# Patient Record
Sex: Female | Born: 1945 | Race: White | Hispanic: No | Smoking: Never smoker
Health system: Southern US, Community
[De-identification: ages and names within clinical notes are randomized; demographics above are authoritative.]

## PROBLEM LIST (undated history)

## (undated) DIAGNOSIS — I10 Essential (primary) hypertension: Secondary | ICD-10-CM

## (undated) DIAGNOSIS — M797 Fibromyalgia: Secondary | ICD-10-CM

---

## 2014-12-25 ENCOUNTER — Inpatient Hospital Stay (HOSPITAL_COMMUNITY)
Admission: EM | Admit: 2014-12-25 | Discharge: 2014-12-28 | DRG: 287 | Disposition: A | Discharge status: Home / Self Care | Payer: Medicare HMO | Attending: Cardiovascular Disease | Admitting: Cardiovascular Disease

## 2014-12-25 ENCOUNTER — Encounter (HOSPITAL_COMMUNITY): Payer: Self-pay

## 2014-12-25 ENCOUNTER — Emergency Department (HOSPITAL_COMMUNITY): Payer: Medicare HMO

## 2014-12-25 ENCOUNTER — Ambulatory Visit (HOSPITAL_COMMUNITY): Payer: Medicare HMO

## 2014-12-25 DIAGNOSIS — F329 Major depressive disorder, single episode, unspecified: Secondary | ICD-10-CM | POA: Diagnosis present

## 2014-12-25 DIAGNOSIS — Z7982 Long term (current) use of aspirin: Secondary | ICD-10-CM | POA: Diagnosis not present

## 2014-12-25 DIAGNOSIS — R079 Chest pain, unspecified: Secondary | ICD-10-CM | POA: Diagnosis present

## 2014-12-25 DIAGNOSIS — I249 Acute ischemic heart disease, unspecified: Secondary | ICD-10-CM | POA: Diagnosis present

## 2014-12-25 DIAGNOSIS — I480 Paroxysmal atrial fibrillation: Secondary | ICD-10-CM | POA: Diagnosis present

## 2014-12-25 DIAGNOSIS — M797 Fibromyalgia: Secondary | ICD-10-CM | POA: Diagnosis present

## 2014-12-25 DIAGNOSIS — E876 Hypokalemia: Secondary | ICD-10-CM | POA: Diagnosis present

## 2014-12-25 DIAGNOSIS — I10 Essential (primary) hypertension: Secondary | ICD-10-CM | POA: Diagnosis present

## 2014-12-25 DIAGNOSIS — I4891 Unspecified atrial fibrillation: Secondary | ICD-10-CM

## 2014-12-25 HISTORY — DX: Fibromyalgia: M79.7

## 2014-12-25 HISTORY — DX: Essential (primary) hypertension: I10

## 2014-12-25 LAB — TROPONIN I
Troponin I: <0.03 ng/mL (ref <0.031)
Troponin I: 0.04 ng/mL — ABNORMAL HIGH (ref <0.031)
Troponin I: 0.04 ng/mL — ABNORMAL HIGH (ref <0.031)
Troponin I: 0.05 ng/mL — ABNORMAL HIGH (ref <0.031)

## 2014-12-25 LAB — BASIC METABOLIC PANEL
Anion gap: 17 — ABNORMAL HIGH (ref 5–15)
Anion gap: 9 (ref 5–15)
BUN: 14 mg/dL (ref 6–20)
BUN: 11 mg/dL (ref 6–20)
CO2: 16 mmol/L — ABNORMAL LOW (ref 22–32)
CO2: 23 mmol/L (ref 22–32)
Calcium: 10 mg/dL (ref 8.9–10.3)
Calcium: 9.6 mg/dL (ref 8.9–10.3)
Chloride: 106 mmol/L (ref 101–111)
Chloride: 105 mmol/L (ref 101–111)
Creatinine, Ser: 1.14 mg/dL — ABNORMAL HIGH (ref 0.44–1.00)
Creatinine, Ser: 0.98 mg/dL (ref 0.44–1.00)
GFR calc Af Amer: 55 mL/min — ABNORMAL LOW (ref >60)
GFR calc Af Amer: >60 mL/min (ref >60)
GFR calc non Af Amer: 48 mL/min — ABNORMAL LOW (ref >60)
GFR calc non Af Amer: 57 mL/min — ABNORMAL LOW (ref >60)
Glucose, Bld: 179 mg/dL — ABNORMAL HIGH (ref 65–99)
Glucose, Bld: 144 mg/dL — ABNORMAL HIGH (ref 65–99)
Potassium: 2.3 mmol/L — CL (ref 3.5–5.1)
Potassium: 2.5 mmol/L — CL (ref 3.5–5.1)
Sodium: 139 mmol/L (ref 135–145)
Sodium: 137 mmol/L (ref 135–145)

## 2014-12-25 LAB — I-STAT CHEM 8, ED
BUN: 14 mg/dL (ref 6–20)
Calcium, Ion: 1.13 mmol/L (ref 1.13–1.30)
Chloride: 105 mmol/L (ref 101–111)
Creatinine, Ser: 0.8 mg/dL (ref 0.44–1.00)
Glucose, Bld: 226 mg/dL — ABNORMAL HIGH (ref 65–99)
HCT: 52 % — ABNORMAL HIGH (ref 36.0–46.0)
Hemoglobin: 17.7 g/dL — ABNORMAL HIGH (ref 12.0–15.0)
Potassium: 2.4 mmol/L — CL (ref 3.5–5.1)
Sodium: 141 mmol/L (ref 135–145)
TCO2: 15 mmol/L (ref 0–100)

## 2014-12-25 LAB — CBC
HCT: 40.3 % (ref 36.0–46.0)
Hemoglobin: 13.3 g/dL (ref 12.0–15.0)
MCH: 28.4 pg (ref 26.0–34.0)
MCHC: 33 g/dL (ref 30.0–36.0)
MCV: 85.9 fL (ref 78.0–100.0)
Platelets: 633 10*3/uL — ABNORMAL HIGH (ref 150–400)
RBC: 4.69 MIL/uL (ref 3.87–5.11)
RDW: 14.3 % (ref 11.5–15.5)
WBC: 15.3 10*3/uL — ABNORMAL HIGH (ref 4.0–10.5)

## 2014-12-25 LAB — BRAIN NATRIURETIC PEPTIDE: B Natriuretic Peptide: 18.8 pg/mL (ref 0.0–100.0)

## 2014-12-25 LAB — HEPARIN LEVEL (UNFRACTIONATED): Heparin Unfractionated: 0.27 [IU]/mL — ABNORMAL LOW (ref 0.30–0.70)

## 2014-12-25 LAB — MRSA PCR SCREENING: MRSA by PCR: NEGATIVE

## 2014-12-25 LAB — MAGNESIUM: Magnesium: 1.6 mg/dL — ABNORMAL LOW (ref 1.7–2.4)

## 2014-12-25 MED ORDER — CARVEDILOL 3.125 MG PO TABS
3.1250 mg | ORAL_TABLET | Freq: Two times a day (BID) | ORAL | Status: DC
  Administered 2014-12-26 – 2014-12-28 (×3): 3.125 mg via ORAL
  Filled 2014-12-25 (×4): qty 1

## 2014-12-25 MED ORDER — METOCLOPRAMIDE HCL 5 MG/ML IJ SOLN
10.0000 mg | Freq: Once | INTRAMUSCULAR | Status: AC
  Administered 2014-12-25: 10 mg via INTRAVENOUS
  Filled 2014-12-25: qty 2

## 2014-12-25 MED ORDER — HEPARIN BOLUS VIA INFUSION
2000.0000 [IU] | Freq: Once | INTRAVENOUS | Status: AC
  Administered 2014-12-25: 2000 [IU] via INTRAVENOUS
  Filled 2014-12-25: qty 2000

## 2014-12-25 MED ORDER — POTASSIUM CHLORIDE CRYS ER 20 MEQ PO TBCR
20.0000 meq | EXTENDED_RELEASE_TABLET | Freq: Two times a day (BID) | ORAL | Status: DC
  Administered 2014-12-25: 20 meq via ORAL
  Filled 2014-12-25: qty 1

## 2014-12-25 MED ORDER — ASPIRIN EC 81 MG PO TBEC
81.0000 mg | DELAYED_RELEASE_TABLET | Freq: Every day | ORAL | Status: DC
  Administered 2014-12-26 – 2014-12-28 (×3): 81 mg via ORAL
  Filled 2014-12-25 (×3): qty 1

## 2014-12-25 MED ORDER — VITAMIN D3 25 MCG (1000 UNIT) PO TABS
2000.0000 [IU] | ORAL_TABLET | Freq: Every day | ORAL | Status: DC
  Administered 2014-12-25 – 2014-12-28 (×4): 2000 [IU] via ORAL
  Filled 2014-12-25 (×7): qty 2

## 2014-12-25 MED ORDER — MAGNESIUM SULFATE 2 GM/50ML IV SOLN
2.0000 g | Freq: Once | INTRAVENOUS | Status: AC
  Administered 2014-12-25: 2 g via INTRAVENOUS
  Filled 2014-12-25: qty 50

## 2014-12-25 MED ORDER — ONDANSETRON HCL 4 MG/2ML IJ SOLN
4.0000 mg | Freq: Once | INTRAMUSCULAR | Status: AC
  Administered 2014-12-25: 4 mg via INTRAVENOUS
  Filled 2014-12-25: qty 2

## 2014-12-25 MED ORDER — POTASSIUM CHLORIDE CRYS ER 20 MEQ PO TBCR
20.0000 meq | EXTENDED_RELEASE_TABLET | ORAL | Status: AC
  Administered 2014-12-25: 20 meq via ORAL
  Filled 2014-12-25: qty 1

## 2014-12-25 MED ORDER — ONDANSETRON HCL 4 MG/2ML IJ SOLN
4.0000 mg | Freq: Four times a day (QID) | INTRAMUSCULAR | Status: DC | PRN
  Administered 2014-12-25: 4 mg via INTRAVENOUS
  Filled 2014-12-25: qty 2

## 2014-12-25 MED ORDER — FLUOXETINE HCL 20 MG PO CAPS
20.0000 mg | ORAL_CAPSULE | Freq: Every day | ORAL | Status: DC
  Administered 2014-12-25 – 2014-12-28 (×4): 20 mg via ORAL
  Filled 2014-12-25 (×4): qty 1

## 2014-12-25 MED ORDER — ACETAMINOPHEN 325 MG PO TABS
650.0000 mg | ORAL_TABLET | ORAL | Status: DC | PRN
  Administered 2014-12-26: 650 mg via ORAL
  Filled 2014-12-25 (×2): qty 2

## 2014-12-25 MED ORDER — NITROGLYCERIN 0.4 MG SL SUBL: 0.4000 mg | SUBLINGUAL_TABLET | SUBLINGUAL | Status: DC | PRN

## 2014-12-25 MED ORDER — BUSPIRONE HCL 15 MG PO TABS
15.0000 mg | ORAL_TABLET | Freq: Two times a day (BID) | ORAL | Status: DC
  Administered 2014-12-25 – 2014-12-28 (×7): 15 mg via ORAL
  Filled 2014-12-25 (×8): qty 1

## 2014-12-25 MED ORDER — HEPARIN (PORCINE) IN NACL 100-0.45 UNIT/ML-% IJ SOLN
1100.0000 [IU]/h | INTRAMUSCULAR | Status: DC
  Administered 2014-12-25: 900 [IU]/h via INTRAVENOUS
  Administered 2014-12-26: 1050 [IU]/h via INTRAVENOUS
  Administered 2014-12-27: 1100 [IU]/h via INTRAVENOUS
  Filled 2014-12-25 (×3): qty 250

## 2014-12-25 MED ORDER — ALPRAZOLAM 0.25 MG PO TABS
0.2500 mg | ORAL_TABLET | Freq: Two times a day (BID) | ORAL | Status: DC | PRN
  Administered 2014-12-25: 0.25 mg via ORAL
  Filled 2014-12-25: qty 1

## 2014-12-25 MED ORDER — POTASSIUM CHLORIDE CRYS ER 20 MEQ PO TBCR
20.0000 meq | EXTENDED_RELEASE_TABLET | Freq: Once | ORAL | Status: AC
  Administered 2014-12-25: 20 meq via ORAL
  Filled 2014-12-25: qty 1

## 2014-12-25 MED ORDER — DILTIAZEM HCL 30 MG PO TABS
30.0000 mg | ORAL_TABLET | Freq: Four times a day (QID) | ORAL | Status: DC
Start: 2014-12-26 — End: 2014-12-26
  Administered 2014-12-25 – 2014-12-26 (×3): 30 mg via ORAL
  Filled 2014-12-25 (×4): qty 1

## 2014-12-25 MED ORDER — POTASSIUM CHLORIDE CRYS ER 20 MEQ PO TBCR
20.0000 meq | EXTENDED_RELEASE_TABLET | Freq: Two times a day (BID) | ORAL | Status: DC
  Administered 2014-12-25 – 2014-12-26 (×2): 20 meq via ORAL
  Filled 2014-12-25 (×2): qty 1

## 2014-12-25 MED ORDER — SODIUM CHLORIDE 0.9 % IJ SOLN: 3.0000 mL | INTRAMUSCULAR | Status: DC | PRN

## 2014-12-25 MED ORDER — DILTIAZEM HCL 100 MG IV SOLR
5.0000 mg/h | INTRAVENOUS | Status: DC
  Administered 2014-12-25: 5 mg/h via INTRAVENOUS
  Administered 2014-12-25: 10 mg/h via INTRAVENOUS
  Filled 2014-12-25 (×3): qty 100

## 2014-12-25 MED ORDER — SODIUM CHLORIDE 0.9 % IJ SOLN
3.0000 mL | Freq: Two times a day (BID) | INTRAMUSCULAR | Status: DC
  Administered 2014-12-25 – 2014-12-27 (×4): 3 mL via INTRAVENOUS

## 2014-12-25 MED ORDER — OFF THE BEAT BOOK
Freq: Once | Status: AC
  Administered 2014-12-25: 17:00:00
  Filled 2014-12-25: qty 1

## 2014-12-25 MED ORDER — ATORVASTATIN CALCIUM 40 MG PO TABS
40.0000 mg | ORAL_TABLET | Freq: Every day | ORAL | Status: DC
  Administered 2014-12-25 – 2014-12-27 (×3): 40 mg via ORAL
  Filled 2014-12-25 (×4): qty 1

## 2014-12-25 MED ORDER — LISINOPRIL 20 MG PO TABS
40.0000 mg | ORAL_TABLET | Freq: Every day | ORAL | Status: DC
  Administered 2014-12-26: 40 mg via ORAL
  Filled 2014-12-25: qty 2

## 2014-12-25 MED ORDER — POTASSIUM CHLORIDE 10 MEQ/100ML IV SOLN
10.0000 meq | Freq: Once | INTRAVENOUS | Status: AC
  Administered 2014-12-25: 10 meq via INTRAVENOUS
  Filled 2014-12-25: qty 100

## 2014-12-25 MED ORDER — DILTIAZEM LOAD VIA INFUSION
15.0000 mg | Freq: Once | INTRAVENOUS | Status: AC
  Administered 2014-12-25: 15 mg via INTRAVENOUS
  Filled 2014-12-25: qty 15

## 2014-12-25 MED ORDER — ASPIRIN 300 MG RE SUPP: 300.0000 mg | RECTAL | Status: AC

## 2014-12-25 MED ORDER — ASPIRIN 81 MG PO CHEW
324.0000 mg | CHEWABLE_TABLET | ORAL | Status: AC
  Administered 2014-12-25: 324 mg via ORAL
  Filled 2014-12-25: qty 4

## 2014-12-25 MED ORDER — SODIUM CHLORIDE 0.9 % IV SOLN: 250.0000 mL | INTRAVENOUS | Status: DC | PRN

## 2014-12-25 MED ORDER — GABAPENTIN 300 MG PO CAPS
300.0000 mg | ORAL_CAPSULE | Freq: Three times a day (TID) | ORAL | Status: DC
  Administered 2014-12-25 – 2014-12-28 (×9): 300 mg via ORAL
  Filled 2014-12-25 (×10): qty 1

## 2014-12-25 NOTE — Care Management Note (Signed)
Case Management Note  Patient Details  Name: Mary Weeks MRN: [id number] Date of Birth: [birth year]  Subjective/Objective:          Adm w at fib         Action/Plan: lives at home   Expected Discharge Date:                  Expected Discharge Plan:     In-House Referral:     Discharge planning Services     Post Acute Care Choice:    Choice offered to:     DME Arranged:    DME Agency:     HH Arranged:    [place] Agency:     Status of Service:     Medicare Important Message Given:    Date Medicare IM Given:    Medicare IM give by:    Date Additional Medicare IM Given:    Additional Medicare Important Message give by:     If discussed at [place] of Stay Meetings, dates discussed:    Additional Comments: ur review done  Lacretia Leigh, RN 12/25/2014, 12:59 PM

## 2014-12-25 NOTE — Progress Notes (Signed)
Patient blood pressure down 88/40 heart rate 95 sinus rhythm asymptomatic.Call placed to Dr. Doylene Canard  Order received to discontinue cardizem drip.Will continue to monitor patient.

## 2014-12-25 NOTE — Progress Notes (Signed)
[facility] for heparin Indication: chest pain/ACS and atrial fibrillation  No Known Allergies  Patient Measurements: Height: 5' (152.4 cm) Weight: 183 lb (83.008 kg) IBW/kg (Calculated) : 45.5 Heparin Dosing Weight: 65kg  Vital Signs: Temp: 97.5 F (36.4 C) (12/05 1215) Temp Source: Oral (12/05 1215) BP: 130/57 mmHg (12/05 1215) Pulse Rate: 123 (12/05 1215)  Labs:  Recent Labs  12/25/14 0400 12/25/14 0429 12/25/14 0849 12/25/14 1425  HGB 13.3 17.7*  --   --   HCT 40.3 52.0*  --   --   PLT 633*  --   --   --   HEPARINUNFRC  --   --   --  0.27*  CREATININE 1.14* 0.80  --   --   TROPONINI <0.03  --  0.04*  --     Estimated Creatinine Clearance: 62.5 mL/min (by C-G formula based on Cr of 0.8).   Medical History: Past Medical History  Diagnosis Date  . Hypertension   . Fibromyalgia     Assessment: 69yo female c/o CP and N/V, found to be in Afib w/ RVR en route and in ED, thought to be 2/2 electrolyte abnormalities, to begin heparin for concern for ACS. Cardiac enzymes negative, patient back in nsr (100-120s). Initial heparin level just below goal at 0.27. No bleeding issues noted.  Goal of Therapy:  Heparin level 0.3-0.7 units/ml Monitor platelets by anticoagulation protocol: Yes   Plan:  Increase heparin rate to 1050/hr and recheck level in am. Daily CBC/HL  Erin Hearing PharmD., BCPS Clinical Pharmacist Pager [phone] 12/25/2014 3:19 PM

## 2014-12-25 NOTE — Progress Notes (Signed)
ANTICOAGULATION CONSULT NOTE - Initial Consult  Pharmacy Consult for heparin Indication: chest pain/ACS and atrial fibrillation  No Known Allergies  Patient Measurements: Height: 5' (152.4 cm) Weight: 183 lb (83.008 kg) IBW/kg (Calculated) : 45.5 Heparin Dosing Weight: 65kg  Vital Signs: Temp: 97.4 F (36.3 C) (12/05 0340) Temp Source: Oral (12/05 0340) BP: 123/78 mmHg (12/05 0545) Pulse Rate: 109 (12/05 0545)  Labs:  Recent Labs  12/25/14 0400 12/25/14 0429  HGB 13.3 17.7*  HCT 40.3 52.0*  PLT 633*  --   CREATININE 1.14* 0.80  TROPONINI <0.03  --     Estimated Creatinine Clearance: 62.5 mL/min (by C-G formula based on Cr of 0.8).   Medical History: Past Medical History  Diagnosis Date  . Hypertension   . Fibromyalgia      Assessment: 69yo female c/o CP and N/V, found to be in Afib w/ RVR en route and in ED, thought to be 2/2 electrolyte abnormalities, to begin heparin for concern for ACS.  Goal of Therapy:  Heparin level 0.3-0.7 units/ml Monitor platelets by anticoagulation protocol: Yes   Plan:  Will give heparin 2000 units x1 followed by gtt at 900 units/hr and monitor heparin levels and CBC.  Wynona Neat, PharmD, BCPS  12/25/2014,6:11 AM

## 2014-12-25 NOTE — Progress Notes (Signed)
  Echocardiogram 2D Echocardiogram has been performed.  Mary Weeks 12/25/2014, 4:32 PM

## 2014-12-25 NOTE — ED Notes (Signed)
Pt come from home via [facility] EMS, around 730 had a pizza and became nauseated,  CP started around around 230, pt in afib with RVR.

## 2014-12-25 NOTE — ED Provider Notes (Signed)
CSN: [id number]     Arrival date & time 12/25/14  0330 History  By signing my name below, I, Evelene Croon, attest that this documentation has been prepared under the direction and in the presence of Noemi Chapel, MD . Electronically Signed: Evelene Croon, Scribe. 12/25/2014. 3:56 AM.     Chief Complaint  Patient presents with  . Atrial Fibrillation    The history is provided by the patient and the EMS personnel. No language interpreter was used.    HPI Comments:  Mary Weeks is a 69 y.o. female  with a history of HTN, who presents to the Emergency Department via EMS from home, complaining of 3/10 CP, onset ~1930 last night with associated palpitations. EMS notes CP was preceded by nausea and 1 episode of vomiting after eating pizza. EMS states pt in AFIB with RVR en route. Pt denies h/o AFIB. She was recently started on magnesium and sodium bicarbonate and was also recently admitted to [facility] for low potassium. She denies constipation and dysuria. No alleviating factors noted.   Past Medical History  Diagnosis Date  . Hypertension   . Fibromyalgia    History reviewed. No pertinent past surgical history. No family history on file. Social History  Substance Use Topics  . Smoking status: Never Smoker   . Smokeless tobacco: None  . Alcohol Use: No   OB History    No data available     Review of Systems  Cardiovascular: Positive for chest pain and palpitations.  Gastrointestinal: Positive for nausea and vomiting. Negative for constipation.  Genitourinary: Negative for dysuria.    Allergies  Review of patient's allergies indicates no known allergies.  Home Medications   Prior to Admission medications   Medication Sig Start Date End Date Taking? Authorizing Provider  aspirin EC 81 MG tablet Take 81 mg by mouth daily.   Yes Historical Provider, MD  busPIRone (BUSPAR) 15 MG tablet Take 15 mg by mouth 2 (two) times daily.   Yes Historical Provider, MD  Cholecalciferol  (VITAMIN D3) 2000 UNITS capsule Take 2,000 Units by mouth daily.   Yes Historical Provider, MD  DULoxetine (CYMBALTA) 30 MG capsule Take 30 mg by mouth daily.   Yes Historical Provider, MD  FLUoxetine (PROZAC) 20 MG capsule Take 20 mg by mouth daily.   Yes Historical Provider, MD  gabapentin (NEURONTIN) 300 MG capsule Take 300 mg by mouth 3 (three) times daily.   Yes Historical Provider, MD  lisinopril (PRINIVIL,ZESTRIL) 40 MG tablet Take 40 mg by mouth daily.   Yes Historical Provider, MD  meloxicam (MOBIC) 15 MG tablet Take 15 mg by mouth daily.   Yes Historical Provider, MD  potassium chloride SA (K-DUR,KLOR-CON) 20 MEQ tablet Take 20 mEq by mouth 2 (two) times daily.   Yes Historical Provider, MD   BP 121/74 mmHg  Pulse 160  Temp(Src) 97.4 F (36.3 C) (Oral)  Resp 22  SpO2 100% Physical Exam  Constitutional: She appears well-developed and well-nourished. No distress.  HENT:  Head: Normocephalic and atraumatic.  Mouth/Throat: Oropharynx is clear and moist. No oropharyngeal exudate.  Eyes: Conjunctivae and EOM are normal. Pupils are equal, round, and reactive to light. Right eye exhibits no discharge. Left eye exhibits no discharge. No scleral icterus.  Neck: Normal range of motion. Neck supple. No JVD present. No thyromegaly present.  Cardiovascular: Normal heart sounds and intact distal pulses.  An irregularly irregular rhythm present. Tachycardia present.  Exam reveals no gallop and no friction rub.   No  murmur heard. Pulses:      Femoral pulses are 2+ on the right side, and 2+ on the left side. Weak radial pulses   Pulmonary/Chest: Effort normal and breath sounds normal. No respiratory distress. She has no wheezes. She has no rales.  Abdominal: Soft. Bowel sounds are normal. She exhibits no distension and no mass. There is no tenderness.  Musculoskeletal: Normal range of motion. She exhibits no edema or tenderness.  Lymphadenopathy:    She has no cervical adenopathy.   Neurological: She is alert. Coordination normal.  Skin: Skin is warm and dry. No rash noted. No erythema.  Psychiatric: She has a normal mood and affect. Her behavior is normal.  Nursing note and vitals reviewed.   ED Course  Procedures   DIAGNOSTIC STUDIES:  Oxygen Saturation is 100% on RA, normal by my interpretation.    COORDINATION OF CARE:  3:38 AM Discussed treatment plan with pt at bedside and pt agreed to plan.  Labs Review Labs Reviewed  BASIC METABOLIC PANEL - Abnormal; Notable for the following:    Potassium 2.3 (*)    CO2 16 (*)    Glucose, Bld 179 (*)    Creatinine, Ser 1.14 (*)    GFR calc non Af Amer 48 (*)    GFR calc Af Amer 55 (*)    Anion gap 17 (*)    All other components within normal limits  CBC - Abnormal; Notable for the following:    WBC 15.3 (*)    Platelets 633 (*)    All other components within normal limits  MAGNESIUM - Abnormal; Notable for the following:    Magnesium 1.6 (*)    All other components within normal limits  I-STAT CHEM 8, ED - Abnormal; Notable for the following:    Potassium 2.4 (*)    Glucose, Bld 226 (*)    Hemoglobin 17.7 (*)    HCT 52.0 (*)    All other components within normal limits  TROPONIN I  BRAIN NATRIURETIC PEPTIDE    Imaging Review Dg Chest Port 1 View  12/25/2014  CLINICAL DATA:  69 year old female with chest pain EXAM: PORTABLE CHEST 1 VIEW COMPARISON:  None. FINDINGS: Single portable view of the chest demonstrates clear lungs. There is no pleural effusion or pneumothorax. Borderline cardiac silhouette. The osseous structures are grossly unremarkable. IMPRESSION: No active disease. Electronically Signed   By: Anner Crete M.D.   On: 12/25/2014 03:52   I have personally reviewed and evaluated these images and lab results as part of my medical decision-making.   EKG Interpretation   Date/Time:  Monday December 25 2014 03:42:39 EST Ventricular Rate:  173 PR Interval:    QRS Duration: 97 QT  Interval:  325 QTC Calculation: 551 R Axis:   -121 Text Interpretation:  Atrial fibrillation with rapid V-rate Ventricular  bigeminy S1,S2,S3 pattern Consider RVH w/ secondary repol abnormality ST  depression, probably rate related Abnormal ekg No old tracing to compare  Confirmed by Darnita Woodrum  MD, [place] ([id number]) on 12/25/2014 3:52:34 AM      EKG Interpretation  Date/Time:  Monday December 25 2014 05:26:05 EST Ventricular Rate:  92 PR Interval:  150 QRS Duration: 92 QT Interval:  395 QTC Calculation: 489 R Axis:   102 Text Interpretation:  Sinus rhythm Atrial premature complexes in couplets Consider right ventricular hypertrophy Borderline T abnormalities, anterior leads Borderline prolonged QT interval Since last tracing Atrial fibrillation has resolved. Confirmed by Sabra Heck  MD, [place] ([id number]) on 12/25/2014 5:47:20  AM        MDM   Final diagnoses:  Atrial fibrillation with rapid ventricular response (HCC)  Hypokalemia  Hypomagnesemia    The patient continued to be in atrial fibrillation with rapid ventricular rate, she ended up having severe hypokalemia with hypomagnesemia, this required both magnesium and potassium replacement intravenously. Her heart rate is now in the 100 range, she has occasional burst of atrial fibrillation, she is still on a Cardizem drip. I have discussed her care with the cardiologist Dr. Doylene Canard who will come to see the patient for admission, now that she is in sinus rhythm she may be admitted to the hospitalist, it is unclear. She requires ongoing cardiac monitoring for QT prolongation due to hypokalemia as well to recurrent arrhythmia. Otherwise labs appear unremarkable. I suspect that her atrial fibrillation was brought on in part by the severe Electra light abnormality.   CRITICAL CARE Performed by: Noemi Chapel, MD Total critical care time: 35 minutes Critical care time was exclusive of separately billable procedures and treating other  patients. Critical care was necessary to treat or prevent imminent or life-threatening deterioration. Critical care was time spent personally by me on the following activities: development of treatment plan with patient and/or surrogate as well as nursing, discussions with consultants, evaluation of patient's response to treatment, examination of patient, obtaining history from patient or surrogate, ordering and performing treatments and interventions, ordering and review of laboratory studies, ordering and review of radiographic studies, pulse oximetry and re-evaluation of patient's condition.  Meds given in ED:  Medications  diltiazem (CARDIZEM) 1 mg/mL load via infusion 15 mg (15 mg Intravenous Given 12/25/14 0405)    And  diltiazem (CARDIZEM) 100 mg in dextrose 5 % 100 mL (1 mg/mL) infusion (15 mg/hr Intravenous Rate/Dose Change 12/25/14 0508)  potassium chloride 10 mEq in 100 mL IVPB (10 mEq Intravenous New Bag/Given 12/25/14 0510)  magnesium sulfate IVPB 2 g 50 mL (2 g Intravenous New Bag/Given 12/25/14 0509)  ondansetron (ZOFRAN) injection 4 mg (4 mg Intravenous Given 12/25/14 0354)  ondansetron (ZOFRAN) injection 4 mg (4 mg Intravenous Given 12/25/14 0414)  metoCLOPramide (REGLAN) injection 10 mg (10 mg Intravenous Given 12/25/14 0430)    New Prescriptions   No medications on file     I personally performed the services described in this documentation, which was scribed in my presence. The recorded information has been reviewed and is accurate.       Noemi Chapel, MD 12/25/14 [phone]

## 2014-12-25 NOTE — ED Notes (Signed)
Report called  

## 2014-12-25 NOTE — H&P (Signed)
Referring Physician:  [name] is an 69 y.o. female.                       Chief Complaint: Chest pain  HPI: 69 y.o. female with a history of fibromyalgia and HTN presents to the Emergency Department via EMS from home, complaining of 3/10 CP, onset ~1930 last night with associated palpitations. EMS notes CP was preceded by nausea and 1 episode of vomiting after eating pizza. EMS states pt in AFIB with RVR en route. Pt denies h/o AFIB. She was recently started on magnesium and sodium bicarbonate and was also recently admitted to [facility] for low potassium.  Past Medical History  Diagnosis Date  . Hypertension   . Fibromyalgia       History reviewed. No pertinent past surgical history.  No family history on file. Social History:  reports that she has never smoked. She does not have any smokeless tobacco history on file. She reports that she does not drink alcohol. Her drug history is not on file.  Allergies: No Known Allergies   (Not in a hospital admission)  Results for orders placed or performed during the hospital encounter of 12/25/14 (from the past 48 hour(s))  Basic metabolic panel     Status: Abnormal   Collection Time: 12/25/14  4:00 AM  Result Value Ref Range   Sodium 139 135 - 145 mmol/L   Potassium 2.3 (LL) 3.5 - 5.1 mmol/L    Comment: CRITICAL RESULT CALLED TO, READ BACK BY AND VERIFIED WITH: SERRAINOLO,J RN 12/25/2014 0449 JORDANS    Chloride 106 101 - 111 mmol/L   CO2 16 (L) 22 - 32 mmol/L   Glucose, Bld 179 (H) 65 - 99 mg/dL   BUN 14 6 - 20 mg/dL   Creatinine, Ser 1.14 (H) 0.44 - 1.00 mg/dL   Calcium 10.0 8.9 - 10.3 mg/dL   GFR calc non Af Amer 48 (L) >60 mL/min   GFR calc Af Amer 55 (L) >60 mL/min    Comment: (NOTE) The eGFR has been calculated using the CKD EPI equation. This calculation has not been validated in all clinical situations. eGFR's persistently <60 mL/min signify possible Chronic Kidney Disease.    Anion gap 17 (H) 5 - 15  CBC      Status: Abnormal   Collection Time: 12/25/14  4:00 AM  Result Value Ref Range   WBC 15.3 (H) 4.0 - 10.5 K/uL   RBC 4.69 3.87 - 5.11 MIL/uL   Hemoglobin 13.3 12.0 - 15.0 g/dL   HCT 40.3 36.0 - 46.0 %   MCV 85.9 78.0 - 100.0 fL   MCH 28.4 26.0 - 34.0 pg   MCHC 33.0 30.0 - 36.0 g/dL   RDW 14.3 11.5 - 15.5 %   Platelets 633 (H) 150 - 400 K/uL  Troponin I     Status: None   Collection Time: 12/25/14  4:00 AM  Result Value Ref Range   Troponin I <0.03 <0.031 ng/mL    Comment:        NO INDICATION OF MYOCARDIAL INJURY.   Magnesium     Status: Abnormal   Collection Time: 12/25/14  4:00 AM  Result Value Ref Range   Magnesium 1.6 (L) 1.7 - 2.4 mg/dL  Brain natriuretic peptide (IF shortness of breath has been documented this visit)     Status: None   Collection Time: 12/25/14  4:00 AM  Result Value Ref Range   B Natriuretic Peptide 18.8  0.0 - 100.0 pg/mL  I-stat chem 8, ed     Status: Abnormal   Collection Time: 12/25/14  4:29 AM  Result Value Ref Range   Sodium 141 135 - 145 mmol/L   Potassium 2.4 (LL) 3.5 - 5.1 mmol/L   Chloride 105 101 - 111 mmol/L   BUN 14 6 - 20 mg/dL   Creatinine, Ser 0.80 0.44 - 1.00 mg/dL   Glucose, Bld 226 (H) 65 - 99 mg/dL   Calcium, Ion 1.13 1.13 - 1.30 mmol/L   TCO2 15 0 - 100 mmol/L   Hemoglobin 17.7 (H) 12.0 - 15.0 g/dL   HCT 52.0 (H) 36.0 - 46.0 %   Dg Chest Port 1 View  12/25/2014  CLINICAL DATA:  69 year old female with chest pain EXAM: PORTABLE CHEST 1 VIEW COMPARISON:  None. FINDINGS: Single portable view of the chest demonstrates clear lungs. There is no pleural effusion or pneumothorax. Borderline cardiac silhouette. The osseous structures are grossly unremarkable. IMPRESSION: No active disease. Electronically Signed   By: Anner Crete M.D.   On: 12/25/2014 03:52    Review Of Systems Cardiovascular: Positive for chest pain and palpitations.  Gastrointestinal: Positive for nausea and vomiting. Negative for constipation.  Genitourinary:  Negative for dysuria.   Blood pressure 123/78, pulse 109, temperature 97.4 F (36.3 C), temperature source Oral, resp. rate 22, SpO2 93 %. Physical Exam  Constitutional: She appears well-developed and well-nourished. No distress.  HENT: Normocephalic and atraumatic. Oropharynx is clear and moist. No oropharyngeal exudate.  Eyes: Conjunctivae and EOM are normal. Pupils are equal, round, and reactive to light. Right eye exhibits no discharge. Left eye exhibits no discharge. No scleral icterus.  Neck: Normal range of motion. Neck supple. No JVD present. No thyromegaly present.  Cardiovascular: Normal heart sounds and intact distal pulses. An irregularly irregular rhythm present. Tachycardia present. Exam reveals no gallop and no friction rub. No murmur heard. Pulses: Femoral pulses are 2+ on the right side, and 2+ on the left side. Weak radial pulses Pulmonary/Chest: Effort normal and breath sounds normal. No respiratory distress.   Abdominal: Soft. Bowel sounds are normal. She exhibits no distension and no mass. There is no tenderness.  Musculoskeletal: Normal range of motion. She exhibits no edema or tenderness.  Lymphadenopathy: She has no cervical adenopathy.  Neurological: She is alert. Coordination normal. Moves all 4 extremities. Skin: Skin is warm and dry. No rash noted. No erythema.  Psychiatric: She has a normal mood and affect. Her behavior is normal.  Nursing note and vitals reviewed.  Assessment/Plan Chest pain r/o MI Atrial fibrillation paroxysmal-CHADSVASC-4/9 Hypertension Depression Fibromyalgia  Admit/IV diltiazem/R/O MI.  Mary Riddle, MD  12/25/2014, 6:09 AM

## 2014-12-25 NOTE — ED Notes (Signed)
Pt received meal tray. Sitting up eating.

## 2014-12-26 LAB — CBC
HCT: 36.6 % (ref 36.0–46.0)
Hemoglobin: 11.6 g/dL — ABNORMAL LOW (ref 12.0–15.0)
MCH: 28 pg (ref 26.0–34.0)
MCHC: 31.7 g/dL (ref 30.0–36.0)
MCV: 88.2 fL (ref 78.0–100.0)
Platelets: 490 10*3/uL — ABNORMAL HIGH (ref 150–400)
RBC: 4.15 MIL/uL (ref 3.87–5.11)
RDW: 14.8 % (ref 11.5–15.5)
WBC: 9.8 10*3/uL (ref 4.0–10.5)

## 2014-12-26 LAB — MAGNESIUM: Magnesium: 2.2 mg/dL (ref 1.7–2.4)

## 2014-12-26 LAB — HEPARIN LEVEL (UNFRACTIONATED)
Heparin Unfractionated: 0.29 [IU]/mL — ABNORMAL LOW (ref 0.30–0.70)
Heparin Unfractionated: 0.91 [IU]/mL — ABNORMAL HIGH (ref 0.30–0.70)
Heparin Unfractionated: 0.48 [IU]/mL (ref 0.30–0.70)

## 2014-12-26 LAB — BASIC METABOLIC PANEL
Anion gap: 9 (ref 5–15)
Anion gap: 8 (ref 5–15)
BUN: 10 mg/dL (ref 6–20)
BUN: 10 mg/dL (ref 6–20)
CO2: 26 mmol/L (ref 22–32)
CO2: 25 mmol/L (ref 22–32)
Calcium: 9.4 mg/dL (ref 8.9–10.3)
Calcium: 9.2 mg/dL (ref 8.9–10.3)
Chloride: 105 mmol/L (ref 101–111)
Chloride: 104 mmol/L (ref 101–111)
Creatinine, Ser: 0.87 mg/dL (ref 0.44–1.00)
Creatinine, Ser: 1.08 mg/dL — ABNORMAL HIGH (ref 0.44–1.00)
GFR calc Af Amer: >60 mL/min (ref >60)
GFR calc Af Amer: 59 mL/min — ABNORMAL LOW (ref >60)
GFR calc non Af Amer: >60 mL/min (ref >60)
GFR calc non Af Amer: 51 mL/min — ABNORMAL LOW (ref >60)
Glucose, Bld: 104 mg/dL — ABNORMAL HIGH (ref 65–99)
Glucose, Bld: 117 mg/dL — ABNORMAL HIGH (ref 65–99)
Potassium: 3 mmol/L — ABNORMAL LOW (ref 3.5–5.1)
Potassium: 5.2 mmol/L — ABNORMAL HIGH (ref 3.5–5.1)
Sodium: 140 mmol/L (ref 135–145)
Sodium: 137 mmol/L (ref 135–145)

## 2014-12-26 LAB — LIPID PANEL
Cholesterol: 143 mg/dL (ref 0–200)
HDL: 51 mg/dL (ref >40)
LDL Cholesterol: 75 mg/dL (ref 0–99)
Total CHOL/HDL Ratio: 2.8 {ratio}
Triglycerides: 85 mg/dL (ref <150)
VLDL: 17 mg/dL (ref 0–40)

## 2014-12-26 LAB — PROTIME-INR
INR: 1.15 (ref 0.00–1.49)
Prothrombin Time: 14.9 seconds (ref 11.6–15.2)

## 2014-12-26 MED ORDER — SODIUM CHLORIDE 0.9 % IV BOLUS (SEPSIS)
250.0000 mL | Freq: Once | INTRAVENOUS | Status: AC
  Administered 2014-12-26: 250 mL via INTRAVENOUS

## 2014-12-26 MED ORDER — POTASSIUM CHLORIDE CRYS ER 20 MEQ PO TBCR
20.0000 meq | EXTENDED_RELEASE_TABLET | Freq: Once | ORAL | Status: AC
  Administered 2014-12-26: 20 meq via ORAL
  Filled 2014-12-26: qty 1

## 2014-12-26 MED ORDER — LISINOPRIL 5 MG PO TABS: 5.0000 mg | ORAL_TABLET | Freq: Every day | ORAL | Status: DC

## 2014-12-26 MED ORDER — POTASSIUM CHLORIDE CRYS ER 20 MEQ PO TBCR
20.0000 meq | EXTENDED_RELEASE_TABLET | ORAL | Status: AC
  Administered 2014-12-26: 20 meq via ORAL
  Filled 2014-12-26: qty 1

## 2014-12-26 MED ORDER — DILTIAZEM HCL 30 MG PO TABS
30.0000 mg | ORAL_TABLET | Freq: Two times a day (BID) | ORAL | Status: DC
  Administered 2014-12-27 – 2014-12-28 (×2): 30 mg via ORAL
  Filled 2014-12-26 (×2): qty 1

## 2014-12-26 NOTE — Progress Notes (Signed)
ANTICOAGULATION CONSULT NOTE  Pharmacy Consult for heparin Indication: chest pain/ACS and atrial fibrillation  No Known Allergies  Patient Measurements: Height: 5' (152.4 cm) Weight: 174 lb 14.4 oz (79.334 kg) IBW/kg (Calculated) : 45.5 Heparin Dosing Weight: 65kg  Vital Signs: Temp: 98.8 F (37.1 C) (12/06 0350) Temp Source: Oral (12/06 0350) BP: 113/57 mmHg (12/06 0350) Pulse Rate: 80 (12/06 0350)  Labs:  Recent Labs  12/25/14 0400 12/25/14 0429 12/25/14 0849 12/25/14 1425 12/25/14 2020 12/26/14 0205  HGB 13.3 17.7*  --   --   --  11.6*  HCT 40.3 52.0*  --   --   --  36.6  PLT 633*  --   --   --   --  490*  LABPROT  --   --   --   --   --  14.9  INR  --   --   --   --   --  1.15  HEPARINUNFRC  --   --   --  0.27*  --  0.29*  CREATININE 1.14* 0.80  --   --  0.98  --   TROPONINI <0.03  --  0.04* 0.04* 0.05*  --     Estimated Creatinine Clearance: 49.8 mL/min (by C-G formula based on Cr of 0.98).  Assessment: 69yo female on heparin for r/o ACS. Heparin level slightly below goal at 0.29 on 1050 units/hr. Hgb down to 11.6, plt elevated. No issues with line or bleeding reported per RN.  Goal of Therapy:  Heparin level 0.3-0.7 units/ml Monitor platelets by anticoagulation protocol: Yes   Plan:  Increase heparin rate to 1300 units/hr F/u 8hr heparin level  Sherlon Handing, PharmD, BCPS Clinical pharmacist, pager [phone] 12/26/2014 4:34 AM

## 2014-12-26 NOTE — Progress Notes (Signed)
ANTICOAGULATION CONSULT NOTE - Follow Up Consult  Pharmacy Consult for Heparin  Indication: chest pain/ACS  No Known Allergies  Patient Measurements: Height: 5' (152.4 cm) Weight: 174 lb 14.4 oz (79.334 kg) IBW/kg (Calculated) : 45.5  Vital Signs: Temp: 98.1 F (36.7 C) (12/06 2320) Temp Source: Oral (12/06 2320) BP: 93/65 mmHg (12/06 2320) Pulse Rate: 76 (12/06 2320)  Labs:  Recent Labs  12/25/14 0400 12/25/14 0429 12/25/14 0849  12/25/14 1425 12/25/14 2020 12/26/14 0205 12/26/14 1243 12/26/14 1525 12/26/14 2221  HGB 13.3 17.7*  --   --   --   --  11.6*  --   --   --   HCT 40.3 52.0*  --   --   --   --  36.6  --   --   --   PLT 633*  --   --   --   --   --  490*  --   --   --   LABPROT  --   --   --   --   --   --  14.9  --   --   --   INR  --   --   --   --   --   --  1.15  --   --   --   HEPARINUNFRC  --   --   --   < > 0.27*  --  0.29* 0.91*  --  0.48  CREATININE 1.14* 0.80  --   --   --  0.98 0.87  --  1.08*  --   TROPONINI <0.03  --  0.04*  --  0.04* 0.05*  --   --   --   --   < > = values in this interval not displayed.  Estimated Creatinine Clearance: 45.1 mL/min (by C-G formula based on Cr of 1.08). Assessment: Therapeutic heparin level x 1 after rate decrease  Goal of Therapy:  Heparin level 0.3-0.7 units/ml Monitor platelets by anticoagulation protocol: Yes   Plan:  -Continue heparin at 1100 units/hr -AM HL -Possible cath 12/7  Narda Bonds 12/26/2014,11:25 PM

## 2014-12-26 NOTE — Progress Notes (Signed)
Pt's BP 89/52, asymptomatic. 250cc NS bolus ordered.  Coreg and Cardizem held, potassium 5.2 and KCL discontinued per Dr. Doylene Canard.

## 2014-12-26 NOTE — Progress Notes (Signed)
Ref: Neale Burly, MD   Subjective:  Feeling better. Potassium gradually improving.  Objective:  Vital Signs in the last 24 hours: Temp:  [97.5 F (36.4 C)-98.8 F (37.1 C)] 97.5 F (36.4 C) (12/06 0757) Pulse Rate:  [80-123] 87 (12/06 0805) Cardiac Rhythm:  [-] Normal sinus rhythm (12/06 0800) Resp:  [14-26] 26 (12/06 0805) BP: (71-132)/(35-61) 105/59 mmHg (12/06 0805) SpO2:  [96 %-99 %] 98 % (12/06 0805) Weight:  [79.334 kg (174 lb 14.4 oz)] 79.334 kg (174 lb 14.4 oz) (12/06 0350)  Physical Exam: BP Readings from Last 1 Encounters:  12/26/14 105/59    Wt Readings from Last 1 Encounters:  12/26/14 79.334 kg (174 lb 14.4 oz)    Weight change: -3.674 kg (-8 lb 1.6 oz)  HEENT: [place]/AT, Eyes-Brown, PERL, EOMI, Conjunctiva-Pink, Sclera-Non-icteric Neck: No JVD, No bruit, Trachea midline. Lungs:  Clear, Bilateral. Cardiac:  Regular rhythm, normal S1 and S2, no S3.  Abdomen:  Soft, non-tender. Extremities:  No edema present. No cyanosis. No clubbing. CNS: AxOx3, Cranial nerves grossly intact, moves all 4 extremities. Right handed. Skin: Warm and dry.   Intake/Output from previous day: 12/05 0701 - 12/06 0700 In: 1686.1 [P.O.:710; I.V.:976.1] Out: 902 [Urine:901; Stool:1]    Lab Results: BMET    Component Value Date/Time   NA 140 12/26/2014 0205   NA 137 12/25/2014 2020   NA 141 12/25/2014 0429   K 3.0* 12/26/2014 0205   K 2.5* 12/25/2014 2020   K 2.4* 12/25/2014 0429   CL 105 12/26/2014 0205   CL 105 12/25/2014 2020   CL 105 12/25/2014 0429   CO2 26 12/26/2014 0205   CO2 23 12/25/2014 2020   CO2 16* 12/25/2014 0400   GLUCOSE 104* 12/26/2014 0205   GLUCOSE 144* 12/25/2014 2020   GLUCOSE 226* 12/25/2014 0429   BUN 10 12/26/2014 0205   BUN 11 12/25/2014 2020   BUN 14 12/25/2014 0429   CREATININE 0.87 12/26/2014 0205   CREATININE 0.98 12/25/2014 2020   CREATININE 0.80 12/25/2014 0429   CALCIUM 9.4 12/26/2014 0205   CALCIUM 9.6 12/25/2014 2020   CALCIUM 10.0  12/25/2014 0400   GFRNONAA >60 12/26/2014 0205   GFRNONAA 57* 12/25/2014 2020   GFRNONAA 48* 12/25/2014 0400   GFRAA >60 12/26/2014 0205   GFRAA >60 12/25/2014 2020   GFRAA 55* 12/25/2014 0400   CBC    Component Value Date/Time   WBC 9.8 12/26/2014 0205   RBC 4.15 12/26/2014 0205   HGB 11.6* 12/26/2014 0205   HCT 36.6 12/26/2014 0205   PLT 490* 12/26/2014 0205   MCV 88.2 12/26/2014 0205   MCH 28.0 12/26/2014 0205   MCHC 31.7 12/26/2014 0205   RDW 14.8 12/26/2014 0205   HEPATIC Function Panel No results for input(s): PROT in the last 8760 hours.  Invalid input(s):  ALBUMIN,  AST,  ALT,  ALKPHOS,  BILIDIR,  IBILI HEMOGLOBIN A1C No components found for: HGA1C,  MPG CARDIAC ENZYMES Lab Results  Component Value Date   TROPONINI 0.05* 12/25/2014   TROPONINI 0.04* 12/25/2014   TROPONINI 0.04* 12/25/2014   BNP No results for input(s): PROBNP in the last 8760 hours. TSH No results for input(s): TSH in the last 8760 hours. CHOLESTEROL  Recent Labs  12/26/14 0205  CHOL 143    Scheduled Meds: . aspirin EC  81 mg Oral Daily  . atorvastatin  40 mg Oral q1800  . busPIRone  15 mg Oral BID  . carvedilol  3.125 mg Oral BID WC  .  cholecalciferol  2,000 Units Oral Daily  . diltiazem  30 mg Oral 4 times per day  . FLUoxetine  20 mg Oral Daily  . gabapentin  300 mg Oral TID  . lisinopril  40 mg Oral Daily  . potassium chloride SA  20 mEq Oral BID  . potassium chloride  20 mEq Oral Once  . potassium chloride  20 mEq Oral Once  . sodium chloride  3 mL Intravenous Q12H   Continuous Infusions: . heparin 1,300 Units/hr (12/26/14 0800)   PRN Meds:.sodium chloride, acetaminophen, ALPRAZolam, nitroGLYCERIN, ondansetron (ZOFRAN) IV, sodium chloride  Assessment/Plan: Chest pain r/o MI Atrial fibrillation paroxysmal-CHADSVASC-4/9 Hypertension Depression Fibromyalgia  Continue PO diltiazem. Continue potassium. Possible cardiac cath in AM.    LOS: 1 day    Mary Dials   MD  12/26/2014, 11:53 AM     F

## 2014-12-26 NOTE — Progress Notes (Signed)
ANTICOAGULATION CONSULT NOTE  Pharmacy Consult for heparin Indication: chest pain/ACS and atrial fibrillation  No Known Allergies  Patient Measurements: Height: 5' (152.4 cm) Weight: 174 lb 14.4 oz (79.334 kg) IBW/kg (Calculated) : 45.5 Heparin Dosing Weight: 65kg  Vital Signs: Temp: 98.5 F (36.9 C) (12/06 1159) Temp Source: Oral (12/06 1159) BP: 95/58 mmHg (12/06 1224) Pulse Rate: 78 (12/06 1159)  Labs:  Recent Labs  12/25/14 0400 12/25/14 0429 12/25/14 0849 12/25/14 1425 12/25/14 2020 12/26/14 0205 12/26/14 1243  HGB 13.3 17.7*  --   --   --  11.6*  --   HCT 40.3 52.0*  --   --   --  36.6  --   PLT 633*  --   --   --   --  490*  --   LABPROT  --   --   --   --   --  14.9  --   INR  --   --   --   --   --  1.15  --   HEPARINUNFRC  --   --   --  0.27*  --  0.29* 0.91*  CREATININE 1.14* 0.80  --   --  0.98 0.87  --   TROPONINI <0.03  --  0.04* 0.04* 0.05*  --   --     Estimated Creatinine Clearance: 56 mL/min (by C-G formula based on Cr of 0.87).  Assessment: 69yo female on heparin for r/o ACS.  Heparin level now above goal at 0.91 on 1300 units/hr. Hgb down to 11.6, plt elevated. No issues with line or bleeding reported per RN.  Plan for cath in am 12/7  Goal of Therapy:  Heparin level 0.3-0.7 units/ml Monitor platelets by anticoagulation protocol: Yes   Plan:  Decrease heparin to 1100 units/hr F/u 8hr heparin level  Erin Hearing PharmD., BCPS Clinical Pharmacist Pager [phone] 12/26/2014 1:38 PM

## 2014-12-27 ENCOUNTER — Encounter (HOSPITAL_COMMUNITY)
Admission: EM | Disposition: A | Discharge status: Home / Self Care | Payer: Self-pay | Source: Home / Self Care | Attending: Cardiovascular Disease

## 2014-12-27 HISTORY — PX: CARDIAC CATHETERIZATION: SHX172

## 2014-12-27 LAB — BASIC METABOLIC PANEL
Anion gap: 6 (ref 5–15)
BUN: 10 mg/dL (ref 6–20)
CO2: 24 mmol/L (ref 22–32)
Calcium: 9 mg/dL (ref 8.9–10.3)
Chloride: 109 mmol/L (ref 101–111)
Creatinine, Ser: 1 mg/dL (ref 0.44–1.00)
GFR calc Af Amer: >60 mL/min (ref >60)
GFR calc non Af Amer: 56 mL/min — ABNORMAL LOW (ref >60)
Glucose, Bld: 97 mg/dL (ref 65–99)
Potassium: 4.4 mmol/L (ref 3.5–5.1)
Sodium: 139 mmol/L (ref 135–145)

## 2014-12-27 LAB — CBC
HCT: 34.2 % — ABNORMAL LOW (ref 36.0–46.0)
Hemoglobin: 10.5 g/dL — ABNORMAL LOW (ref 12.0–15.0)
MCH: 27.6 pg (ref 26.0–34.0)
MCHC: 30.7 g/dL (ref 30.0–36.0)
MCV: 89.8 fL (ref 78.0–100.0)
Platelets: 380 10*3/uL (ref 150–400)
RBC: 3.81 MIL/uL — ABNORMAL LOW (ref 3.87–5.11)
RDW: 15 % (ref 11.5–15.5)
WBC: 7.4 10*3/uL (ref 4.0–10.5)

## 2014-12-27 LAB — HEPARIN LEVEL (UNFRACTIONATED): Heparin Unfractionated: 0.41 IU/mL (ref 0.30–0.70)

## 2014-12-27 LAB — POCT ACTIVATED CLOTTING TIME: Activated Clotting Time: 147 s

## 2014-12-27 SURGERY — LEFT HEART CATH AND CORONARY ANGIOGRAPHY
Anesthesia: LOCAL

## 2014-12-27 MED ORDER — SODIUM CHLORIDE 0.9 % IV SOLN: 250.0000 mL | INTRAVENOUS | Status: DC | PRN

## 2014-12-27 MED ORDER — SODIUM CHLORIDE 0.9 % IV SOLN: INTRAVENOUS | Status: AC

## 2014-12-27 MED ORDER — FENTANYL CITRATE (PF) 100 MCG/2ML IJ SOLN
INTRAMUSCULAR | Status: AC
  Filled 2014-12-27: qty 2

## 2014-12-27 MED ORDER — SODIUM CHLORIDE 0.9 % IV SOLN
INTRAVENOUS | Status: DC
  Administered 2014-12-27: 09:00:00 via INTRAVENOUS

## 2014-12-27 MED ORDER — MIDAZOLAM HCL 2 MG/2ML IJ SOLN
INTRAMUSCULAR | Status: DC | PRN
  Administered 2014-12-27: 1 mg via INTRAVENOUS

## 2014-12-27 MED ORDER — MIDAZOLAM HCL 2 MG/2ML IJ SOLN
INTRAMUSCULAR | Status: AC
  Filled 2014-12-27: qty 2

## 2014-12-27 MED ORDER — SODIUM CHLORIDE 0.9 % IJ SOLN: 3.0000 mL | INTRAMUSCULAR | Status: DC | PRN

## 2014-12-27 MED ORDER — SODIUM CHLORIDE 0.9 % IJ SOLN: 3.0000 mL | Freq: Two times a day (BID) | INTRAMUSCULAR | Status: DC

## 2014-12-27 MED ORDER — LIDOCAINE HCL (PF) 1 % IJ SOLN
INTRAMUSCULAR | Status: AC
  Filled 2014-12-27: qty 30

## 2014-12-27 MED ORDER — SODIUM CHLORIDE 0.9 % IV BOLUS (SEPSIS)
100.0000 mL | Freq: Once | INTRAVENOUS | Status: AC
  Administered 2014-12-27: 100 mL via INTRAVENOUS

## 2014-12-27 MED ORDER — FENTANYL CITRATE (PF) 100 MCG/2ML IJ SOLN
INTRAMUSCULAR | Status: DC | PRN
  Administered 2014-12-27 (×2): 25 ug via INTRAVENOUS

## 2014-12-27 MED ORDER — IOHEXOL 350 MG/ML SOLN
INTRAVENOUS | Status: DC | PRN
  Administered 2014-12-27: 35 mL via INTRA_ARTERIAL

## 2014-12-27 MED ORDER — SODIUM CHLORIDE 0.9 % IV SOLN
INTRAVENOUS | Status: DC
  Administered 2014-12-27: 02:00:00 via INTRAVENOUS

## 2014-12-27 MED ORDER — LIDOCAINE HCL (PF) 1 % IJ SOLN
INTRAMUSCULAR | Status: DC | PRN
  Administered 2014-12-27: 15:00:00

## 2014-12-27 MED ORDER — SODIUM CHLORIDE 0.9 % IJ SOLN
3.0000 mL | Freq: Two times a day (BID) | INTRAMUSCULAR | Status: DC
  Administered 2014-12-27: 3 mL via INTRAVENOUS

## 2014-12-27 SURGICAL SUPPLY — 7 items
CATH INFINITI 5FR MULTPACK ANG (CATHETERS) ×2 IMPLANT
KIT HEART LEFT (KITS) ×2 IMPLANT
PACK CARDIAC CATHETERIZATION (CUSTOM PROCEDURE TRAY) ×2 IMPLANT
SHEATH PINNACLE 5F 10CM (SHEATH) ×2 IMPLANT
SYR MEDRAD MARK V 150ML (SYRINGE) ×2 IMPLANT
TRANSDUCER W/STOPCOCK (MISCELLANEOUS) ×2 IMPLANT
WIRE EMERALD 3MM-J .035X150CM (WIRE) ×2 IMPLANT

## 2014-12-27 NOTE — Interval H&P Note (Signed)
History and Physical Interval Note:  12/27/2014 2:21 PM  Mary Weeks  has presented today for surgery, with the diagnosis of chest pain  The various methods of treatment have been discussed with the patient and family. After consideration of risks, benefits and other options for treatment, the patient has consented to  Procedure(s): Left Heart Cath and Coronary Angiography (N/A) as a surgical intervention .  The patient's history has been reviewed, patient examined, no change in status, stable for surgery.  I have reviewed the patient's chart and labs.  Questions were answered to the patient's satisfaction.     Lulie Hurd S

## 2014-12-27 NOTE — Progress Notes (Signed)
ANTICOAGULATION CONSULT NOTE  Pharmacy Consult for heparin Indication: chest pain/ACS and atrial fibrillation  No Known Allergies  Patient Measurements: Height: 5' (152.4 cm) Weight: 179 lb 3.2 oz (81.285 kg) IBW/kg (Calculated) : 45.5 Heparin Dosing Weight: 65kg  Vital Signs: Temp: 98.1 F (36.7 C) (12/07 0330) Temp Source: Oral (12/07 0330) BP: 99/59 mmHg (12/07 0430) Pulse Rate: 76 (12/07 0430)  Labs:  Recent Labs  12/25/14 0400 12/25/14 0429 12/25/14 0849  12/25/14 1425 12/25/14 2020 12/26/14 0205 12/26/14 1243 12/26/14 1525 12/26/14 2221 12/27/14 0408  HGB 13.3 17.7*  --   --   --   --  11.6*  --   --   --  10.5*  HCT 40.3 52.0*  --   --   --   --  36.6  --   --   --  34.2*  PLT 633*  --   --   --   --   --  490*  --   --   --  380  LABPROT  --   --   --   --   --   --  14.9  --   --   --   --   INR  --   --   --   --   --   --  1.15  --   --   --   --   HEPARINUNFRC  --   --   --   < > 0.27*  --  0.29* 0.91*  --  0.48 0.41  CREATININE 1.14* 0.80  --   --   --  0.98 0.87  --  1.08*  --  1.00  TROPONINI <0.03  --  0.04*  --  0.04* 0.05*  --   --   --   --   --   < > = values in this interval not displayed.  Estimated Creatinine Clearance: 49.4 mL/min (by C-G formula based on Cr of 1).  Assessment: 69yo female on heparin for r/o ACS.  Heparin level now at goal at 0.41 on 1100 units/hr. Hgb down to 10.5, plt 380. No issues with line or bleeding reported per RN.  Plan for possible cath today  Goal of Therapy:  Heparin level 0.3-0.7 units/ml Monitor platelets by anticoagulation protocol: Yes   Plan:  Continue heparin at 1100 units/hr F/u cardiac plan  Erin Hearing PharmD., BCPS Clinical Pharmacist Pager [phone] 12/27/2014 7:33 AM

## 2014-12-27 NOTE — Progress Notes (Signed)
Site area: rt groin fa sheath pulled and pressure held by [place] Prior to Removal:  Level   0 Pressure Applied For:   20 minutes Manual:   yes Patient Status During Pull:  stable Post Pull Site:  Level  0 Post Pull Instructions Given:  yes Post Pull Pulses Present: yes Dressing Applied:  yes Bedrest begins 1550 Comments: VS remain stable during sheath pull.

## 2014-12-28 ENCOUNTER — Encounter (HOSPITAL_COMMUNITY): Payer: Self-pay | Admitting: Cardiovascular Disease

## 2014-12-28 LAB — IRON AND TIBC
Iron: 46 ug/dL (ref 28–170)
Saturation Ratios: 15 % (ref 10.4–31.8)
TIBC: 302 ug/dL (ref 250–450)
UIBC: 256 ug/dL

## 2014-12-28 LAB — CBC
HCT: 34.6 % — ABNORMAL LOW (ref 36.0–46.0)
Hemoglobin: 10.7 g/dL — ABNORMAL LOW (ref 12.0–15.0)
MCH: 27.9 pg (ref 26.0–34.0)
MCHC: 30.9 g/dL (ref 30.0–36.0)
MCV: 90.1 fL (ref 78.0–100.0)
Platelets: 363 10*3/uL (ref 150–400)
RBC: 3.84 MIL/uL — ABNORMAL LOW (ref 3.87–5.11)
RDW: 14.6 % (ref 11.5–15.5)
WBC: 6.2 10*3/uL (ref 4.0–10.5)

## 2014-12-28 LAB — BASIC METABOLIC PANEL
Anion gap: 5 (ref 5–15)
BUN: 9 mg/dL (ref 6–20)
CO2: 26 mmol/L (ref 22–32)
Calcium: 9.1 mg/dL (ref 8.9–10.3)
Chloride: 109 mmol/L (ref 101–111)
Creatinine, Ser: 0.83 mg/dL (ref 0.44–1.00)
GFR calc Af Amer: >60 mL/min (ref >60)
GFR calc non Af Amer: >60 mL/min (ref >60)
Glucose, Bld: 108 mg/dL — ABNORMAL HIGH (ref 65–99)
Potassium: 4.3 mmol/L (ref 3.5–5.1)
Sodium: 140 mmol/L (ref 135–145)

## 2014-12-28 LAB — FERRITIN: Ferritin: 23 ng/mL (ref 11–307)

## 2014-12-28 MED ORDER — RIVAROXABAN 20 MG PO TABS
20.0000 mg | ORAL_TABLET | Freq: Every day | ORAL | Status: DC
  Administered 2014-12-28: 20 mg via ORAL
  Filled 2014-12-28: qty 1

## 2014-12-28 MED ORDER — RIVAROXABAN 20 MG PO TABS: 20.0000 mg | ORAL_TABLET | Freq: Every day | ORAL | Status: AC

## 2014-12-28 MED ORDER — DILTIAZEM HCL 30 MG PO TABS: 30.0000 mg | ORAL_TABLET | Freq: Two times a day (BID) | ORAL | Status: AC

## 2014-12-28 MED ORDER — GABAPENTIN 300 MG PO CAPS: 300.0000 mg | ORAL_CAPSULE | Freq: Every day | ORAL | Status: AC

## 2014-12-28 MED ORDER — CARVEDILOL 3.125 MG PO TABS: 3.1250 mg | ORAL_TABLET | Freq: Two times a day (BID) | ORAL | Status: AC

## 2014-12-28 MED ORDER — POTASSIUM CHLORIDE CRYS ER 20 MEQ PO TBCR: 20.0000 meq | EXTENDED_RELEASE_TABLET | Freq: Every day | ORAL | Status: AC

## 2014-12-28 NOTE — Care Management Note (Signed)
Case Management Note  Patient Details  Name: Mary Weeks MRN: [id number] Date of Birth: [birth year]  Subjective/Objective:                    Action/Plan: CM met with patient to discuss discharge needs. Patient is agreeable to [facility] services and has chosen [facility]. Butch Penny with [facility] was notified and has accepted the referral for discharge home today.  Expected Discharge Date:                  Expected Discharge Plan:  [place]  In-House Referral:     Discharge planning Services  CM Consult  Post Acute Care Choice:  Home Health Choice offered to:  Patient  DME Arranged:    DME Agency:     HH Arranged:  RN, PT, Nurse's Aide [place] Agency:  [place]  Status of Service:  Completed, signed off  Medicare Important Message Given:    Date Medicare IM Given:    Medicare IM give by:    Date Additional Medicare IM Given:    Additional Medicare Important Message give by:     If discussed at [place] of Stay Meetings, dates discussed:    Additional CommentsRolm Baptise, RN 12/28/2014, 11:09 AM [phone]

## 2014-12-28 NOTE — Discharge Instructions (Signed)

## 2014-12-28 NOTE — Care Management Important Message (Signed)
Important Message  Patient Details  Name: Mary Weeks MRN: [id number] Date of Birth: 1945/04/25   Medicare Important Message Given:  Yes    [name] P Edcouch 12/28/2014, 11:16 AM

## 2014-12-28 NOTE — Discharge Summary (Signed)
Physician Discharge Summary  Patient ID: Mary Weeks MRN: [id number] DOB/AGE: 1945/06/18 69 y.o.  Admit date: 12/25/2014 Discharge date: 12/28/2014  Admission Diagnoses: Chest pain Atrial fibrillation, paroxysmal-CHADSVASC-4/9 Hypertension Depression Fibromyalgia  Discharge Diagnoses:  Principle Problem: *Chest pain* Atrial fibrillation paroxysmal-CHADSVASC-4/9 Hypertension Depression Fibromyalgia Hypokalemia  Discharged Condition: fair  Hospital Course: 69 y.o. female with a history of fibromyalgia and HTN presents to the Emergency Department via EMS from home, complaining of 3/10 CP, onset ~1930 last night with associated palpitations. EMS notes CP was preceded by nausea and 1 episode of vomiting after eating pizza. EMS states pt in AFIB with RVR en route. Pt denies h/o AFIB. She was recently started on magnesium and sodium bicarbonate and was also recently admitted to [facility] for low potassium. She underwent cardiac catheterization that showed near normal coronaries. She required potassium supplementation for her chronic hypokalemia and low dose diltiazem and Xarelto for her paroxysmal atrial fibrillation. Her lisinopril was discontinued for hypotension. She will have home health assistance for ambulation. She will be followed by me in 1 week.  Consults: cardiology  Significant Diagnostic Studies: labs: CBC was normal, On day of discharge post hydration and IV heparin use Hgb was 10.5 g/dL. Iron studies are pending. Lipid panel was normal. Magnesium low at 1.6 and potassium was 2.3, both improved to normal post supplementation. Troponin-I minimally elevated at 0.04 to 0.05.  Chest XR-normal  Cardiac cath-Near normal coronaries.  Echocardiogram: Left ventricle: The cavity size was normal. There was mildconcentric hypertrophy. Systolic function was normal. The estimated ejection fraction was in the range of 60% to 65%. Wallmotion was normal; there were no regional wall  motionabnormalities. Doppler parameters are consistent with abnormalleft ventricular relaxation (grade 1 diastolic dysfunction). - Ascending aorta: The ascending aorta was mildly dilated. - Mitral valve: Calcified annulus. - Pericardium, extracardiac: A prominent pericardial fat pad waspresent. A trivial pericardial effusion was identified.  Treatments: cardiac meds: Potassium, carvedilol, diltiazem and Xarelto  Discharge Exam: Blood pressure 110/52, pulse 84, temperature 98.4 F (36.9 C), temperature source Oral, resp. rate 14, height 5' (1.524 m), weight 81.33 kg (179 lb 4.8 oz), SpO2 99 %. HEENT: [place]/AT, Eyes- PERL, EOMI, Conjunctiva-Pink, Sclera-Non-icteric Neck: No JVD, No bruit, Trachea midline. Lungs: Clear, Bilateral. Cardiac: Regular rhythm, normal S1 and S2, no S3. II/VI systolic murmur. Abdomen: Soft, non-tender. Extremities: No edema present. No cyanosis. No clubbing. No right groin hematoma. CNS: AxOx3, Cranial nerves grossly intact, moves all 4 extremities. Right handed. Skin: Warm and dry.  Disposition: 01 Home, self care     Medication List    STOP taking these medications        aspirin EC 81 MG tablet     DULoxetine 30 MG capsule  Commonly known as:  CYMBALTA     lisinopril 40 MG tablet  Commonly known as:  PRINIVIL,ZESTRIL     meloxicam 15 MG tablet  Commonly known as:  MOBIC      TAKE these medications        busPIRone 15 MG tablet  Commonly known as:  BUSPAR  Take 15 mg by mouth 2 (two) times daily.     carvedilol 3.125 MG tablet  Commonly known as:  COREG  Take 1 tablet (3.125 mg total) by mouth 2 (two) times daily with a meal.     diltiazem 30 MG tablet  Commonly known as:  CARDIZEM  Take 1 tablet (30 mg total) by mouth every 12 (twelve) hours.     FLUoxetine 20 MG capsule  Commonly known as:  PROZAC  Take 20 mg by mouth daily.     gabapentin 300 MG capsule  Commonly known as:  NEURONTIN  Take 1 capsule (300 mg total) by mouth at  bedtime.     potassium chloride SA 20 MEQ tablet  Commonly known as:  K-DUR,KLOR-CON  Take 1 tablet (20 mEq total) by mouth daily.     rivaroxaban 20 MG Tabs tablet  Commonly known as:  XARELTO  Take 1 tablet (20 mg total) by mouth daily.     Vitamin D3 2000 UNITS capsule  Take 2,000 Units by mouth daily.           Follow-up Information    Follow up with Neale Burly, MD. Schedule an appointment as soon as possible for a visit in 1 month.   Specialty:  Internal Medicine   Contact information:   [address] [place] [id number] [place]       Follow up with [facility] S, MD. Schedule an appointment as soon as possible for a visit in 1 week.   Specialty:  Cardiology   Contact information:   [place] [address] [phone]       Signed: Birdie Riddle 12/28/2014, 7:45 AM

## 2015-01-25 DIAGNOSIS — R51 Headache: Secondary | ICD-10-CM | POA: Diagnosis not present

## 2015-01-25 DIAGNOSIS — R220 Localized swelling, mass and lump, head: Secondary | ICD-10-CM | POA: Diagnosis not present

## 2015-01-25 DIAGNOSIS — Z79899 Other long term (current) drug therapy: Secondary | ICD-10-CM | POA: Diagnosis not present

## 2015-01-25 DIAGNOSIS — W0110XA Fall on same level from slipping, tripping and stumbling with subsequent striking against unspecified object, initial encounter: Secondary | ICD-10-CM | POA: Diagnosis not present

## 2015-01-25 DIAGNOSIS — S0990XA Unspecified injury of head, initial encounter: Secondary | ICD-10-CM | POA: Diagnosis not present

## 2015-01-25 DIAGNOSIS — Z7901 Long term (current) use of anticoagulants: Secondary | ICD-10-CM | POA: Diagnosis not present

## 2015-01-25 DIAGNOSIS — S0101XA Laceration without foreign body of scalp, initial encounter: Secondary | ICD-10-CM | POA: Diagnosis not present

## 2015-01-25 DIAGNOSIS — S0003XA Contusion of scalp, initial encounter: Secondary | ICD-10-CM | POA: Diagnosis not present

## 2015-01-26 DIAGNOSIS — Z1389 Encounter for screening for other disorder: Secondary | ICD-10-CM | POA: Diagnosis not present

## 2015-01-26 DIAGNOSIS — I482 Chronic atrial fibrillation: Secondary | ICD-10-CM | POA: Diagnosis not present

## 2015-01-26 DIAGNOSIS — M545 Low back pain: Secondary | ICD-10-CM | POA: Diagnosis not present

## 2015-01-26 DIAGNOSIS — Z0000 Encounter for general adult medical examination without abnormal findings: Secondary | ICD-10-CM | POA: Diagnosis not present

## 2015-01-31 DIAGNOSIS — I482 Chronic atrial fibrillation: Secondary | ICD-10-CM | POA: Diagnosis not present

## 2015-01-31 DIAGNOSIS — M797 Fibromyalgia: Secondary | ICD-10-CM | POA: Diagnosis not present

## 2015-01-31 DIAGNOSIS — W1839XA Other fall on same level, initial encounter: Secondary | ICD-10-CM | POA: Diagnosis not present

## 2015-01-31 DIAGNOSIS — F418 Other specified anxiety disorders: Secondary | ICD-10-CM | POA: Diagnosis not present

## 2015-01-31 DIAGNOSIS — D649 Anemia, unspecified: Secondary | ICD-10-CM | POA: Diagnosis not present

## 2015-01-31 DIAGNOSIS — M545 Low back pain: Secondary | ICD-10-CM | POA: Diagnosis not present

## 2015-01-31 DIAGNOSIS — R1031 Right lower quadrant pain: Secondary | ICD-10-CM | POA: Diagnosis not present

## 2015-01-31 DIAGNOSIS — R1013 Epigastric pain: Secondary | ICD-10-CM | POA: Diagnosis not present

## 2015-01-31 DIAGNOSIS — T45515A Adverse effect of anticoagulants, initial encounter: Secondary | ICD-10-CM | POA: Diagnosis not present

## 2015-01-31 DIAGNOSIS — I4891 Unspecified atrial fibrillation: Secondary | ICD-10-CM | POA: Diagnosis not present

## 2015-01-31 DIAGNOSIS — S301XXA Contusion of abdominal wall, initial encounter: Secondary | ICD-10-CM | POA: Diagnosis not present

## 2015-01-31 DIAGNOSIS — R109 Unspecified abdominal pain: Secondary | ICD-10-CM | POA: Diagnosis not present

## 2015-01-31 DIAGNOSIS — G8929 Other chronic pain: Secondary | ICD-10-CM | POA: Diagnosis not present

## 2015-01-31 DIAGNOSIS — K625 Hemorrhage of anus and rectum: Secondary | ICD-10-CM | POA: Diagnosis not present

## 2015-01-31 DIAGNOSIS — K661 Hemoperitoneum: Secondary | ICD-10-CM | POA: Diagnosis not present

## 2015-01-31 DIAGNOSIS — R1032 Left lower quadrant pain: Secondary | ICD-10-CM | POA: Diagnosis not present

## 2015-01-31 DIAGNOSIS — R1011 Right upper quadrant pain: Secondary | ICD-10-CM | POA: Diagnosis not present

## 2015-01-31 DIAGNOSIS — M6289 Other specified disorders of muscle: Secondary | ICD-10-CM | POA: Diagnosis not present

## 2015-01-31 DIAGNOSIS — D6489 Other specified anemias: Secondary | ICD-10-CM | POA: Diagnosis not present

## 2015-02-09 DIAGNOSIS — I482 Chronic atrial fibrillation: Secondary | ICD-10-CM | POA: Diagnosis not present

## 2015-02-09 DIAGNOSIS — Z6833 Body mass index (BMI) 33.0-33.9, adult: Secondary | ICD-10-CM | POA: Diagnosis not present

## 2015-02-09 DIAGNOSIS — K51511 Left sided colitis with rectal bleeding: Secondary | ICD-10-CM | POA: Diagnosis not present

## 2015-02-14 DIAGNOSIS — M810 Age-related osteoporosis without current pathological fracture: Secondary | ICD-10-CM | POA: Diagnosis not present

## 2015-02-14 DIAGNOSIS — Z90710 Acquired absence of both cervix and uterus: Secondary | ICD-10-CM | POA: Diagnosis not present

## 2015-02-14 DIAGNOSIS — Z79899 Other long term (current) drug therapy: Secondary | ICD-10-CM | POA: Diagnosis not present

## 2015-02-14 DIAGNOSIS — M8589 Other specified disorders of bone density and structure, multiple sites: Secondary | ICD-10-CM | POA: Diagnosis not present

## 2015-02-14 DIAGNOSIS — M797 Fibromyalgia: Secondary | ICD-10-CM | POA: Diagnosis not present

## 2015-02-14 DIAGNOSIS — Z780 Asymptomatic menopausal state: Secondary | ICD-10-CM | POA: Diagnosis not present

## 2015-02-14 DIAGNOSIS — E28319 Asymptomatic premature menopause: Secondary | ICD-10-CM | POA: Diagnosis not present

## 2015-03-04 DIAGNOSIS — F418 Other specified anxiety disorders: Secondary | ICD-10-CM | POA: Diagnosis not present

## 2015-03-04 DIAGNOSIS — Z85820 Personal history of malignant melanoma of skin: Secondary | ICD-10-CM | POA: Diagnosis not present

## 2015-03-04 DIAGNOSIS — I10 Essential (primary) hypertension: Secondary | ICD-10-CM | POA: Diagnosis not present

## 2015-03-04 DIAGNOSIS — Z6833 Body mass index (BMI) 33.0-33.9, adult: Secondary | ICD-10-CM | POA: Diagnosis not present

## 2015-03-04 DIAGNOSIS — F331 Major depressive disorder, recurrent, moderate: Secondary | ICD-10-CM | POA: Diagnosis not present

## 2015-03-04 DIAGNOSIS — I4891 Unspecified atrial fibrillation: Secondary | ICD-10-CM | POA: Diagnosis not present

## 2015-03-27 DIAGNOSIS — Z6833 Body mass index (BMI) 33.0-33.9, adult: Secondary | ICD-10-CM | POA: Diagnosis not present

## 2015-03-27 DIAGNOSIS — M545 Low back pain: Secondary | ICD-10-CM | POA: Diagnosis not present

## 2015-04-04 DIAGNOSIS — H25011 Cortical age-related cataract, right eye: Secondary | ICD-10-CM | POA: Diagnosis not present

## 2015-04-04 DIAGNOSIS — H25012 Cortical age-related cataract, left eye: Secondary | ICD-10-CM | POA: Diagnosis not present

## 2015-04-04 DIAGNOSIS — H35033 Hypertensive retinopathy, bilateral: Secondary | ICD-10-CM | POA: Diagnosis not present

## 2015-04-04 DIAGNOSIS — H2512 Age-related nuclear cataract, left eye: Secondary | ICD-10-CM | POA: Diagnosis not present

## 2015-04-04 DIAGNOSIS — H35361 Drusen (degenerative) of macula, right eye: Secondary | ICD-10-CM | POA: Diagnosis not present

## 2015-04-04 DIAGNOSIS — H2511 Age-related nuclear cataract, right eye: Secondary | ICD-10-CM | POA: Diagnosis not present

## 2015-04-17 DIAGNOSIS — H2512 Age-related nuclear cataract, left eye: Secondary | ICD-10-CM | POA: Diagnosis not present

## 2015-05-17 DIAGNOSIS — H2511 Age-related nuclear cataract, right eye: Secondary | ICD-10-CM | POA: Diagnosis not present

## 2015-05-17 DIAGNOSIS — H25011 Cortical age-related cataract, right eye: Secondary | ICD-10-CM | POA: Diagnosis not present

## 2015-05-28 DIAGNOSIS — M545 Low back pain: Secondary | ICD-10-CM | POA: Diagnosis not present

## 2015-05-28 DIAGNOSIS — Z6833 Body mass index (BMI) 33.0-33.9, adult: Secondary | ICD-10-CM | POA: Diagnosis not present

## 2015-05-28 DIAGNOSIS — I10 Essential (primary) hypertension: Secondary | ICD-10-CM | POA: Diagnosis not present

## 2015-06-05 DIAGNOSIS — H2511 Age-related nuclear cataract, right eye: Secondary | ICD-10-CM | POA: Diagnosis not present

## 2015-07-27 DIAGNOSIS — Z6834 Body mass index (BMI) 34.0-34.9, adult: Secondary | ICD-10-CM | POA: Diagnosis not present

## 2015-07-27 DIAGNOSIS — M545 Low back pain: Secondary | ICD-10-CM | POA: Diagnosis not present

## 2015-07-27 DIAGNOSIS — Z1159 Encounter for screening for other viral diseases: Secondary | ICD-10-CM | POA: Diagnosis not present

## 2015-07-27 DIAGNOSIS — I10 Essential (primary) hypertension: Secondary | ICD-10-CM | POA: Diagnosis not present

## 2015-07-27 DIAGNOSIS — F3289 Other specified depressive episodes: Secondary | ICD-10-CM | POA: Diagnosis not present

## 2015-09-27 DIAGNOSIS — I10 Essential (primary) hypertension: Secondary | ICD-10-CM | POA: Diagnosis not present

## 2015-09-27 DIAGNOSIS — M545 Low back pain: Secondary | ICD-10-CM | POA: Diagnosis not present

## 2015-09-27 DIAGNOSIS — Z6834 Body mass index (BMI) 34.0-34.9, adult: Secondary | ICD-10-CM | POA: Diagnosis not present

## 2015-09-27 DIAGNOSIS — F3289 Other specified depressive episodes: Secondary | ICD-10-CM | POA: Diagnosis not present

## 2015-10-15 DIAGNOSIS — M1712 Unilateral primary osteoarthritis, left knee: Secondary | ICD-10-CM | POA: Diagnosis not present

## 2015-10-15 DIAGNOSIS — M797 Fibromyalgia: Secondary | ICD-10-CM | POA: Diagnosis not present

## 2015-10-24 DIAGNOSIS — Z79891 Long term (current) use of opiate analgesic: Secondary | ICD-10-CM | POA: Diagnosis not present

## 2015-10-24 DIAGNOSIS — M1712 Unilateral primary osteoarthritis, left knee: Secondary | ICD-10-CM | POA: Diagnosis not present

## 2015-10-30 DIAGNOSIS — M7122 Synovial cyst of popliteal space [Baker], left knee: Secondary | ICD-10-CM | POA: Diagnosis not present

## 2015-10-30 DIAGNOSIS — M25562 Pain in left knee: Secondary | ICD-10-CM | POA: Diagnosis not present

## 2015-10-30 DIAGNOSIS — M23332 Other meniscus derangements, other medial meniscus, left knee: Secondary | ICD-10-CM | POA: Diagnosis not present

## 2015-10-30 DIAGNOSIS — M1712 Unilateral primary osteoarthritis, left knee: Secondary | ICD-10-CM | POA: Diagnosis not present

## 2015-11-27 DIAGNOSIS — I10 Essential (primary) hypertension: Secondary | ICD-10-CM | POA: Diagnosis not present

## 2015-11-27 DIAGNOSIS — Z6833 Body mass index (BMI) 33.0-33.9, adult: Secondary | ICD-10-CM | POA: Diagnosis not present

## 2015-11-27 DIAGNOSIS — F3289 Other specified depressive episodes: Secondary | ICD-10-CM | POA: Diagnosis not present

## 2015-11-27 DIAGNOSIS — M545 Low back pain: Secondary | ICD-10-CM | POA: Diagnosis not present

## 2016-05-04 IMAGING — CR DG CHEST 1V PORT
1 series · 1 of 1 positions shown · non-contrast
Comparison: None.

CLINICAL DATA: 69-year-old female with chest pain

EXAM:
PORTABLE CHEST 1 VIEW

[AP]
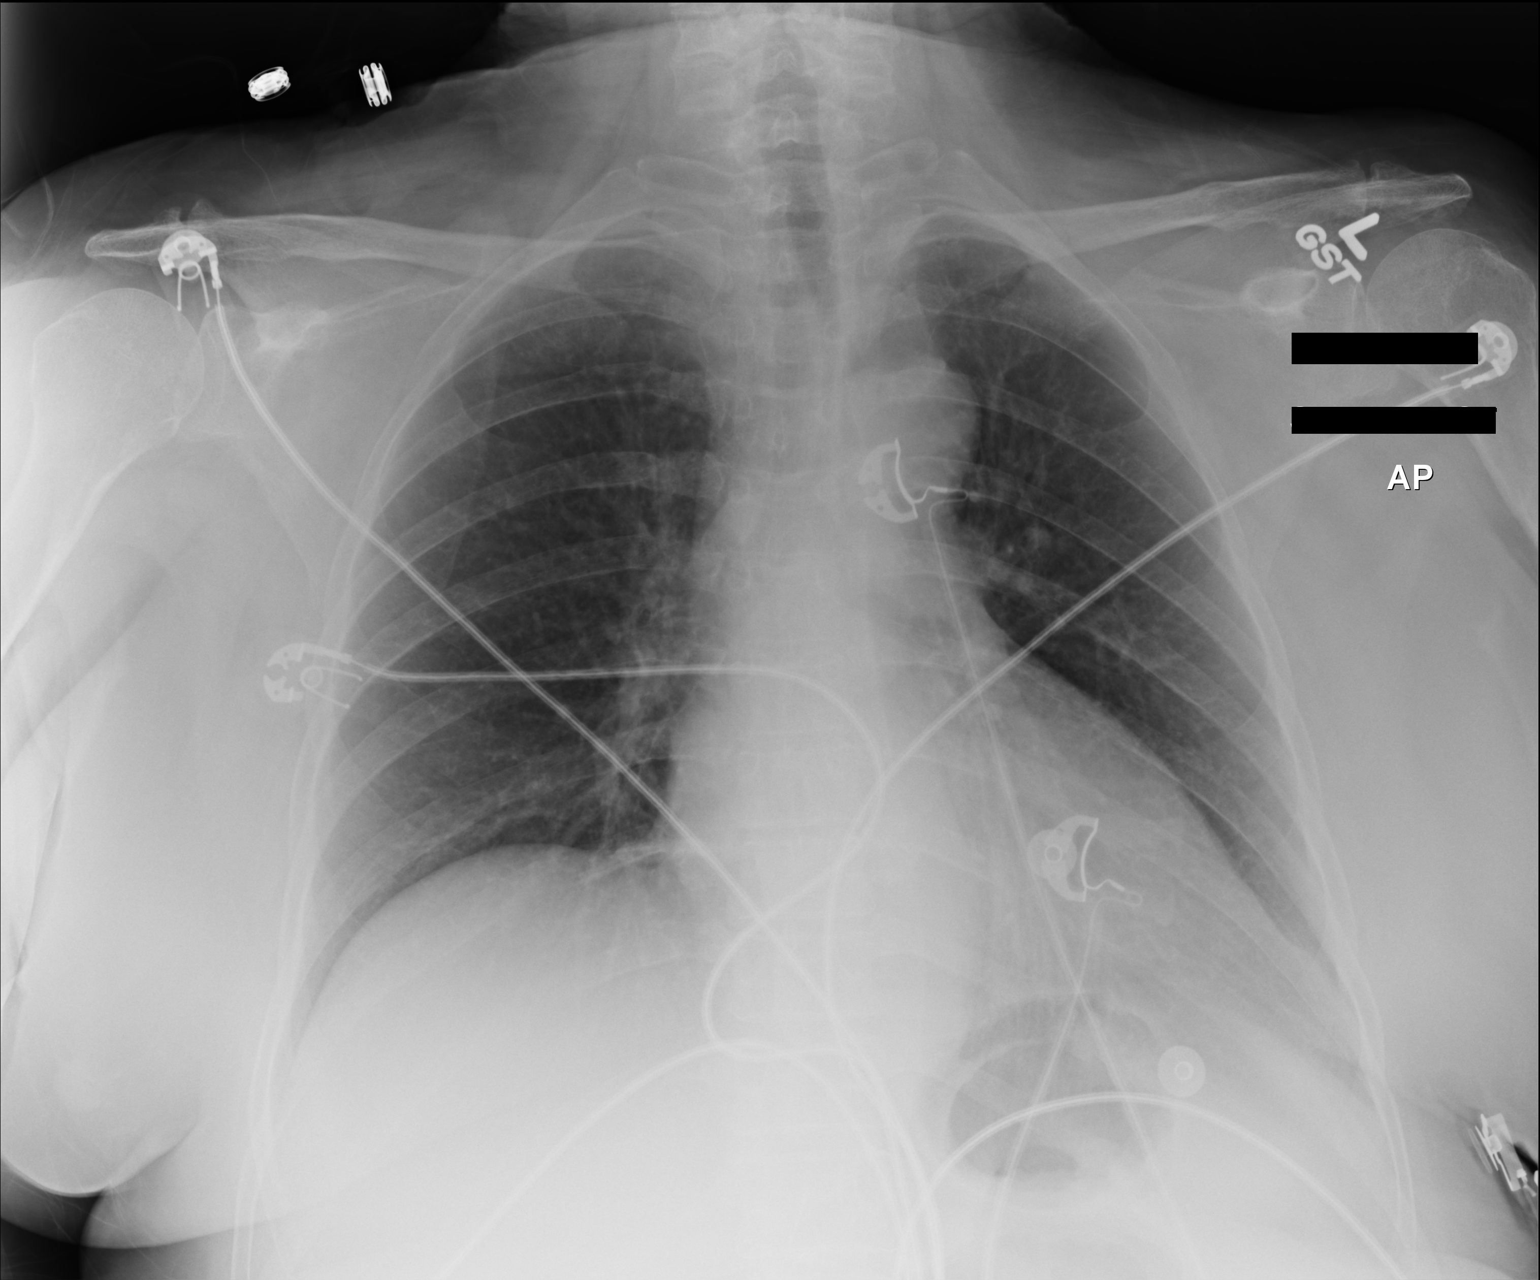

[1 of 1 positions shown; findings below may reference images not displayed]

FINDINGS: Single portable view of the chest demonstrates clear lungs. There is
no pleural effusion or pneumothorax. Borderline cardiac silhouette.
The osseous structures are grossly unremarkable.
IMPRESSION: No active disease.

## 2017-02-11 DIAGNOSIS — Z6835 Body mass index (BMI) 35.0-35.9, adult: Secondary | ICD-10-CM | POA: Diagnosis not present

## 2017-02-11 DIAGNOSIS — M545 Low back pain: Secondary | ICD-10-CM | POA: Diagnosis not present

## 2017-02-11 DIAGNOSIS — R69 Illness, unspecified: Secondary | ICD-10-CM | POA: Diagnosis not present

## 2017-02-11 DIAGNOSIS — I10 Essential (primary) hypertension: Secondary | ICD-10-CM | POA: Diagnosis not present

## 2017-03-23 DIAGNOSIS — M25561 Pain in right knee: Secondary | ICD-10-CM | POA: Diagnosis not present

## 2017-03-23 DIAGNOSIS — M1711 Unilateral primary osteoarthritis, right knee: Secondary | ICD-10-CM | POA: Diagnosis not present

## 2017-03-23 DIAGNOSIS — Z1231 Encounter for screening mammogram for malignant neoplasm of breast: Secondary | ICD-10-CM | POA: Diagnosis not present

## 2017-03-23 DIAGNOSIS — M170 Bilateral primary osteoarthritis of knee: Secondary | ICD-10-CM | POA: Insufficient documentation

## 2017-03-23 DIAGNOSIS — Z96652 Presence of left artificial knee joint: Secondary | ICD-10-CM | POA: Diagnosis not present

## 2017-03-27 DIAGNOSIS — R69 Illness, unspecified: Secondary | ICD-10-CM | POA: Diagnosis not present

## 2017-03-27 DIAGNOSIS — M545 Low back pain: Secondary | ICD-10-CM | POA: Diagnosis not present

## 2017-03-27 DIAGNOSIS — I10 Essential (primary) hypertension: Secondary | ICD-10-CM | POA: Diagnosis not present

## 2017-03-27 DIAGNOSIS — R5383 Other fatigue: Secondary | ICD-10-CM | POA: Diagnosis not present

## 2017-03-27 DIAGNOSIS — Z6835 Body mass index (BMI) 35.0-35.9, adult: Secondary | ICD-10-CM | POA: Diagnosis not present

## 2017-03-27 DIAGNOSIS — D518 Other vitamin B12 deficiency anemias: Secondary | ICD-10-CM | POA: Diagnosis not present

## 2017-04-02 DIAGNOSIS — Z1389 Encounter for screening for other disorder: Secondary | ICD-10-CM | POA: Diagnosis not present

## 2017-04-02 DIAGNOSIS — Z0000 Encounter for general adult medical examination without abnormal findings: Secondary | ICD-10-CM | POA: Diagnosis not present

## 2017-04-02 DIAGNOSIS — Z6835 Body mass index (BMI) 35.0-35.9, adult: Secondary | ICD-10-CM | POA: Diagnosis not present

## 2017-04-02 DIAGNOSIS — J40 Bronchitis, not specified as acute or chronic: Secondary | ICD-10-CM | POA: Diagnosis not present

## 2017-05-14 DIAGNOSIS — I10 Essential (primary) hypertension: Secondary | ICD-10-CM | POA: Diagnosis not present

## 2017-05-14 DIAGNOSIS — Z6835 Body mass index (BMI) 35.0-35.9, adult: Secondary | ICD-10-CM | POA: Diagnosis not present

## 2017-05-14 DIAGNOSIS — M545 Low back pain: Secondary | ICD-10-CM | POA: Diagnosis not present

## 2017-05-14 DIAGNOSIS — I482 Chronic atrial fibrillation: Secondary | ICD-10-CM | POA: Diagnosis not present

## 2017-05-14 DIAGNOSIS — M80062A Age-related osteoporosis with current pathological fracture, left lower leg, initial encounter for fracture: Secondary | ICD-10-CM | POA: Diagnosis not present

## 2017-07-22 DIAGNOSIS — Z96652 Presence of left artificial knee joint: Secondary | ICD-10-CM | POA: Diagnosis not present

## 2017-07-22 DIAGNOSIS — M1711 Unilateral primary osteoarthritis, right knee: Secondary | ICD-10-CM | POA: Diagnosis not present

## 2017-08-13 DIAGNOSIS — Z6834 Body mass index (BMI) 34.0-34.9, adult: Secondary | ICD-10-CM | POA: Diagnosis not present

## 2017-08-13 DIAGNOSIS — I10 Essential (primary) hypertension: Secondary | ICD-10-CM | POA: Diagnosis not present

## 2017-08-13 DIAGNOSIS — M80062A Age-related osteoporosis with current pathological fracture, left lower leg, initial encounter for fracture: Secondary | ICD-10-CM | POA: Diagnosis not present

## 2017-08-13 DIAGNOSIS — M545 Low back pain: Secondary | ICD-10-CM | POA: Diagnosis not present

## 2017-08-13 DIAGNOSIS — I482 Chronic atrial fibrillation: Secondary | ICD-10-CM | POA: Diagnosis not present

## 2017-09-22 DIAGNOSIS — H6523 Chronic serous otitis media, bilateral: Secondary | ICD-10-CM | POA: Diagnosis not present

## 2017-09-22 DIAGNOSIS — Z6834 Body mass index (BMI) 34.0-34.9, adult: Secondary | ICD-10-CM | POA: Diagnosis not present

## 2017-11-18 DIAGNOSIS — Z6835 Body mass index (BMI) 35.0-35.9, adult: Secondary | ICD-10-CM | POA: Diagnosis not present

## 2017-11-18 DIAGNOSIS — Z0000 Encounter for general adult medical examination without abnormal findings: Secondary | ICD-10-CM | POA: Diagnosis not present

## 2017-11-25 DIAGNOSIS — M5410 Radiculopathy, site unspecified: Secondary | ICD-10-CM | POA: Diagnosis not present

## 2017-11-25 DIAGNOSIS — M1711 Unilateral primary osteoarthritis, right knee: Secondary | ICD-10-CM | POA: Diagnosis not present

## 2018-02-23 DIAGNOSIS — M545 Low back pain: Secondary | ICD-10-CM | POA: Diagnosis not present

## 2018-02-23 DIAGNOSIS — M818 Other osteoporosis without current pathological fracture: Secondary | ICD-10-CM | POA: Diagnosis not present

## 2018-02-23 DIAGNOSIS — J40 Bronchitis, not specified as acute or chronic: Secondary | ICD-10-CM | POA: Diagnosis not present

## 2018-02-23 DIAGNOSIS — Z0000 Encounter for general adult medical examination without abnormal findings: Secondary | ICD-10-CM | POA: Diagnosis not present

## 2018-02-23 DIAGNOSIS — I10 Essential (primary) hypertension: Secondary | ICD-10-CM | POA: Diagnosis not present

## 2018-02-23 DIAGNOSIS — I482 Chronic atrial fibrillation, unspecified: Secondary | ICD-10-CM | POA: Diagnosis not present

## 2018-02-25 DIAGNOSIS — M1711 Unilateral primary osteoarthritis, right knee: Secondary | ICD-10-CM | POA: Diagnosis not present

## 2018-02-25 DIAGNOSIS — Z96652 Presence of left artificial knee joint: Secondary | ICD-10-CM | POA: Diagnosis not present

## 2018-03-05 DIAGNOSIS — M1711 Unilateral primary osteoarthritis, right knee: Secondary | ICD-10-CM | POA: Diagnosis not present

## 2018-03-05 DIAGNOSIS — M7121 Synovial cyst of popliteal space [Baker], right knee: Secondary | ICD-10-CM | POA: Diagnosis not present

## 2018-03-05 DIAGNOSIS — S83281A Other tear of lateral meniscus, current injury, right knee, initial encounter: Secondary | ICD-10-CM | POA: Diagnosis not present

## 2018-03-05 DIAGNOSIS — M25461 Effusion, right knee: Secondary | ICD-10-CM | POA: Diagnosis not present

## 2018-04-05 DIAGNOSIS — M810 Age-related osteoporosis without current pathological fracture: Secondary | ICD-10-CM | POA: Diagnosis not present

## 2018-04-14 DIAGNOSIS — J4531 Mild persistent asthma with (acute) exacerbation: Secondary | ICD-10-CM | POA: Diagnosis not present

## 2018-05-24 DIAGNOSIS — G8929 Other chronic pain: Secondary | ICD-10-CM | POA: Diagnosis not present

## 2018-05-24 DIAGNOSIS — Z79899 Other long term (current) drug therapy: Secondary | ICD-10-CM | POA: Diagnosis not present

## 2018-05-24 DIAGNOSIS — Z7982 Long term (current) use of aspirin: Secondary | ICD-10-CM | POA: Diagnosis not present

## 2018-05-24 DIAGNOSIS — I10 Essential (primary) hypertension: Secondary | ICD-10-CM | POA: Diagnosis not present

## 2018-05-24 DIAGNOSIS — Z833 Family history of diabetes mellitus: Secondary | ICD-10-CM | POA: Diagnosis not present

## 2018-05-24 DIAGNOSIS — Z801 Family history of malignant neoplasm of trachea, bronchus and lung: Secondary | ICD-10-CM | POA: Diagnosis not present

## 2018-05-24 DIAGNOSIS — E876 Hypokalemia: Secondary | ICD-10-CM | POA: Diagnosis not present

## 2018-05-24 DIAGNOSIS — I4891 Unspecified atrial fibrillation: Secondary | ICD-10-CM | POA: Diagnosis not present

## 2018-05-24 DIAGNOSIS — Z0000 Encounter for general adult medical examination without abnormal findings: Secondary | ICD-10-CM | POA: Diagnosis not present

## 2018-05-24 DIAGNOSIS — M545 Low back pain: Secondary | ICD-10-CM | POA: Diagnosis not present

## 2018-05-24 DIAGNOSIS — Z96652 Presence of left artificial knee joint: Secondary | ICD-10-CM | POA: Diagnosis not present

## 2018-05-24 DIAGNOSIS — R079 Chest pain, unspecified: Secondary | ICD-10-CM | POA: Diagnosis not present

## 2018-05-24 DIAGNOSIS — Z888 Allergy status to other drugs, medicaments and biological substances status: Secondary | ICD-10-CM | POA: Diagnosis not present

## 2018-05-24 DIAGNOSIS — Z1389 Encounter for screening for other disorder: Secondary | ICD-10-CM | POA: Diagnosis not present

## 2018-05-25 DIAGNOSIS — Z801 Family history of malignant neoplasm of trachea, bronchus and lung: Secondary | ICD-10-CM | POA: Diagnosis not present

## 2018-05-25 DIAGNOSIS — R079 Chest pain, unspecified: Secondary | ICD-10-CM | POA: Diagnosis not present

## 2018-05-25 DIAGNOSIS — I4891 Unspecified atrial fibrillation: Secondary | ICD-10-CM | POA: Diagnosis not present

## 2018-05-25 DIAGNOSIS — M545 Low back pain: Secondary | ICD-10-CM | POA: Diagnosis not present

## 2018-05-25 DIAGNOSIS — E876 Hypokalemia: Secondary | ICD-10-CM | POA: Diagnosis not present

## 2018-05-25 DIAGNOSIS — Z888 Allergy status to other drugs, medicaments and biological substances status: Secondary | ICD-10-CM | POA: Diagnosis not present

## 2018-05-25 DIAGNOSIS — Z833 Family history of diabetes mellitus: Secondary | ICD-10-CM | POA: Diagnosis not present

## 2018-05-25 DIAGNOSIS — Z79899 Other long term (current) drug therapy: Secondary | ICD-10-CM | POA: Diagnosis not present

## 2018-05-25 DIAGNOSIS — Z96652 Presence of left artificial knee joint: Secondary | ICD-10-CM | POA: Diagnosis not present

## 2018-05-25 DIAGNOSIS — I10 Essential (primary) hypertension: Secondary | ICD-10-CM | POA: Diagnosis not present

## 2018-05-25 DIAGNOSIS — Z7982 Long term (current) use of aspirin: Secondary | ICD-10-CM | POA: Diagnosis not present

## 2018-05-25 DIAGNOSIS — G8929 Other chronic pain: Secondary | ICD-10-CM | POA: Diagnosis not present

## 2018-06-03 DIAGNOSIS — I209 Angina pectoris, unspecified: Secondary | ICD-10-CM | POA: Diagnosis not present

## 2018-07-13 DIAGNOSIS — M1711 Unilateral primary osteoarthritis, right knee: Secondary | ICD-10-CM | POA: Diagnosis not present

## 2018-07-13 DIAGNOSIS — Z01812 Encounter for preprocedural laboratory examination: Secondary | ICD-10-CM | POA: Diagnosis not present

## 2018-07-13 DIAGNOSIS — R7309 Other abnormal glucose: Secondary | ICD-10-CM | POA: Diagnosis not present

## 2018-07-13 DIAGNOSIS — Z22322 Carrier or suspected carrier of Methicillin resistant Staphylococcus aureus: Secondary | ICD-10-CM | POA: Diagnosis not present

## 2018-07-21 DIAGNOSIS — I10 Essential (primary) hypertension: Secondary | ICD-10-CM | POA: Diagnosis not present

## 2018-08-06 DIAGNOSIS — R82998 Other abnormal findings in urine: Secondary | ICD-10-CM | POA: Diagnosis not present

## 2018-08-06 DIAGNOSIS — Z1159 Encounter for screening for other viral diseases: Secondary | ICD-10-CM | POA: Diagnosis not present

## 2018-08-06 DIAGNOSIS — M1711 Unilateral primary osteoarthritis, right knee: Secondary | ICD-10-CM | POA: Diagnosis not present

## 2018-08-06 DIAGNOSIS — Z01812 Encounter for preprocedural laboratory examination: Secondary | ICD-10-CM | POA: Diagnosis not present

## 2018-08-10 DIAGNOSIS — G8929 Other chronic pain: Secondary | ICD-10-CM | POA: Diagnosis not present

## 2018-08-10 DIAGNOSIS — M797 Fibromyalgia: Secondary | ICD-10-CM | POA: Diagnosis not present

## 2018-08-10 DIAGNOSIS — Z79899 Other long term (current) drug therapy: Secondary | ICD-10-CM | POA: Diagnosis not present

## 2018-08-10 DIAGNOSIS — Z96651 Presence of right artificial knee joint: Secondary | ICD-10-CM | POA: Diagnosis not present

## 2018-08-10 DIAGNOSIS — Z886 Allergy status to analgesic agent status: Secondary | ICD-10-CM | POA: Diagnosis not present

## 2018-08-10 DIAGNOSIS — M6281 Muscle weakness (generalized): Secondary | ICD-10-CM | POA: Diagnosis not present

## 2018-08-10 DIAGNOSIS — G8918 Other acute postprocedural pain: Secondary | ICD-10-CM | POA: Diagnosis not present

## 2018-08-10 DIAGNOSIS — Z7982 Long term (current) use of aspirin: Secondary | ICD-10-CM | POA: Diagnosis not present

## 2018-08-10 DIAGNOSIS — E876 Hypokalemia: Secondary | ICD-10-CM | POA: Diagnosis not present

## 2018-08-10 DIAGNOSIS — Z1159 Encounter for screening for other viral diseases: Secondary | ICD-10-CM | POA: Diagnosis not present

## 2018-08-10 DIAGNOSIS — M545 Low back pain: Secondary | ICD-10-CM | POA: Diagnosis not present

## 2018-08-10 DIAGNOSIS — Z9049 Acquired absence of other specified parts of digestive tract: Secondary | ICD-10-CM | POA: Diagnosis not present

## 2018-08-10 DIAGNOSIS — I10 Essential (primary) hypertension: Secondary | ICD-10-CM | POA: Diagnosis not present

## 2018-08-10 DIAGNOSIS — R262 Difficulty in walking, not elsewhere classified: Secondary | ICD-10-CM | POA: Diagnosis not present

## 2018-08-10 DIAGNOSIS — Z87891 Personal history of nicotine dependence: Secondary | ICD-10-CM | POA: Diagnosis not present

## 2018-08-10 DIAGNOSIS — M1711 Unilateral primary osteoarthritis, right knee: Secondary | ICD-10-CM | POA: Diagnosis not present

## 2018-08-10 DIAGNOSIS — I4891 Unspecified atrial fibrillation: Secondary | ICD-10-CM | POA: Diagnosis not present

## 2018-08-11 DIAGNOSIS — G8929 Other chronic pain: Secondary | ICD-10-CM | POA: Diagnosis not present

## 2018-08-11 DIAGNOSIS — M797 Fibromyalgia: Secondary | ICD-10-CM | POA: Diagnosis not present

## 2018-08-11 DIAGNOSIS — M545 Low back pain: Secondary | ICD-10-CM | POA: Diagnosis not present

## 2018-08-11 DIAGNOSIS — Z9049 Acquired absence of other specified parts of digestive tract: Secondary | ICD-10-CM | POA: Diagnosis not present

## 2018-08-11 DIAGNOSIS — M6281 Muscle weakness (generalized): Secondary | ICD-10-CM | POA: Diagnosis not present

## 2018-08-11 DIAGNOSIS — Z886 Allergy status to analgesic agent status: Secondary | ICD-10-CM | POA: Diagnosis not present

## 2018-08-11 DIAGNOSIS — Z87891 Personal history of nicotine dependence: Secondary | ICD-10-CM | POA: Diagnosis not present

## 2018-08-11 DIAGNOSIS — E876 Hypokalemia: Secondary | ICD-10-CM | POA: Diagnosis not present

## 2018-08-11 DIAGNOSIS — I4891 Unspecified atrial fibrillation: Secondary | ICD-10-CM | POA: Diagnosis not present

## 2018-08-11 DIAGNOSIS — Z7982 Long term (current) use of aspirin: Secondary | ICD-10-CM | POA: Diagnosis not present

## 2018-08-11 DIAGNOSIS — Z79899 Other long term (current) drug therapy: Secondary | ICD-10-CM | POA: Diagnosis not present

## 2018-08-11 DIAGNOSIS — Z1159 Encounter for screening for other viral diseases: Secondary | ICD-10-CM | POA: Diagnosis not present

## 2018-08-11 DIAGNOSIS — M1711 Unilateral primary osteoarthritis, right knee: Secondary | ICD-10-CM | POA: Diagnosis not present

## 2018-08-11 DIAGNOSIS — R262 Difficulty in walking, not elsewhere classified: Secondary | ICD-10-CM | POA: Diagnosis not present

## 2018-08-11 DIAGNOSIS — I10 Essential (primary) hypertension: Secondary | ICD-10-CM | POA: Diagnosis not present

## 2018-08-12 DIAGNOSIS — I10 Essential (primary) hypertension: Secondary | ICD-10-CM | POA: Diagnosis not present

## 2018-08-12 DIAGNOSIS — Z1159 Encounter for screening for other viral diseases: Secondary | ICD-10-CM | POA: Diagnosis not present

## 2018-08-12 DIAGNOSIS — Z886 Allergy status to analgesic agent status: Secondary | ICD-10-CM | POA: Diagnosis not present

## 2018-08-12 DIAGNOSIS — M797 Fibromyalgia: Secondary | ICD-10-CM | POA: Diagnosis not present

## 2018-08-12 DIAGNOSIS — M1711 Unilateral primary osteoarthritis, right knee: Secondary | ICD-10-CM | POA: Diagnosis not present

## 2018-08-12 DIAGNOSIS — E876 Hypokalemia: Secondary | ICD-10-CM | POA: Diagnosis not present

## 2018-08-12 DIAGNOSIS — M545 Low back pain: Secondary | ICD-10-CM | POA: Diagnosis not present

## 2018-08-12 DIAGNOSIS — Z87891 Personal history of nicotine dependence: Secondary | ICD-10-CM | POA: Diagnosis not present

## 2018-08-12 DIAGNOSIS — Z9049 Acquired absence of other specified parts of digestive tract: Secondary | ICD-10-CM | POA: Diagnosis not present

## 2018-08-12 DIAGNOSIS — R262 Difficulty in walking, not elsewhere classified: Secondary | ICD-10-CM | POA: Diagnosis not present

## 2018-08-12 DIAGNOSIS — M6281 Muscle weakness (generalized): Secondary | ICD-10-CM | POA: Diagnosis not present

## 2018-08-12 DIAGNOSIS — Z79899 Other long term (current) drug therapy: Secondary | ICD-10-CM | POA: Diagnosis not present

## 2018-08-12 DIAGNOSIS — I4891 Unspecified atrial fibrillation: Secondary | ICD-10-CM | POA: Diagnosis not present

## 2018-08-12 DIAGNOSIS — G8929 Other chronic pain: Secondary | ICD-10-CM | POA: Diagnosis not present

## 2018-08-12 DIAGNOSIS — Z7982 Long term (current) use of aspirin: Secondary | ICD-10-CM | POA: Diagnosis not present

## 2018-08-13 DIAGNOSIS — Z7982 Long term (current) use of aspirin: Secondary | ICD-10-CM | POA: Diagnosis not present

## 2018-08-13 DIAGNOSIS — Z886 Allergy status to analgesic agent status: Secondary | ICD-10-CM | POA: Diagnosis not present

## 2018-08-13 DIAGNOSIS — I4891 Unspecified atrial fibrillation: Secondary | ICD-10-CM | POA: Diagnosis not present

## 2018-08-13 DIAGNOSIS — M545 Low back pain: Secondary | ICD-10-CM | POA: Diagnosis not present

## 2018-08-13 DIAGNOSIS — R262 Difficulty in walking, not elsewhere classified: Secondary | ICD-10-CM | POA: Diagnosis not present

## 2018-08-13 DIAGNOSIS — Z9049 Acquired absence of other specified parts of digestive tract: Secondary | ICD-10-CM | POA: Diagnosis not present

## 2018-08-13 DIAGNOSIS — M6281 Muscle weakness (generalized): Secondary | ICD-10-CM | POA: Diagnosis not present

## 2018-08-13 DIAGNOSIS — M797 Fibromyalgia: Secondary | ICD-10-CM | POA: Diagnosis not present

## 2018-08-13 DIAGNOSIS — Z79899 Other long term (current) drug therapy: Secondary | ICD-10-CM | POA: Diagnosis not present

## 2018-08-13 DIAGNOSIS — Z1159 Encounter for screening for other viral diseases: Secondary | ICD-10-CM | POA: Diagnosis not present

## 2018-08-13 DIAGNOSIS — Z87891 Personal history of nicotine dependence: Secondary | ICD-10-CM | POA: Diagnosis not present

## 2018-08-13 DIAGNOSIS — E876 Hypokalemia: Secondary | ICD-10-CM | POA: Diagnosis not present

## 2018-08-13 DIAGNOSIS — M1711 Unilateral primary osteoarthritis, right knee: Secondary | ICD-10-CM | POA: Diagnosis not present

## 2018-08-13 DIAGNOSIS — I10 Essential (primary) hypertension: Secondary | ICD-10-CM | POA: Diagnosis not present

## 2018-08-13 DIAGNOSIS — G8929 Other chronic pain: Secondary | ICD-10-CM | POA: Diagnosis not present

## 2018-08-14 DIAGNOSIS — M545 Low back pain: Secondary | ICD-10-CM | POA: Diagnosis not present

## 2018-08-14 DIAGNOSIS — I10 Essential (primary) hypertension: Secondary | ICD-10-CM | POA: Diagnosis not present

## 2018-08-14 DIAGNOSIS — Z886 Allergy status to analgesic agent status: Secondary | ICD-10-CM | POA: Diagnosis not present

## 2018-08-14 DIAGNOSIS — Z87891 Personal history of nicotine dependence: Secondary | ICD-10-CM | POA: Diagnosis not present

## 2018-08-14 DIAGNOSIS — M1711 Unilateral primary osteoarthritis, right knee: Secondary | ICD-10-CM | POA: Diagnosis not present

## 2018-08-14 DIAGNOSIS — Z79899 Other long term (current) drug therapy: Secondary | ICD-10-CM | POA: Diagnosis not present

## 2018-08-14 DIAGNOSIS — Z1159 Encounter for screening for other viral diseases: Secondary | ICD-10-CM | POA: Diagnosis not present

## 2018-08-14 DIAGNOSIS — M6281 Muscle weakness (generalized): Secondary | ICD-10-CM | POA: Diagnosis not present

## 2018-08-14 DIAGNOSIS — R262 Difficulty in walking, not elsewhere classified: Secondary | ICD-10-CM | POA: Diagnosis not present

## 2018-08-14 DIAGNOSIS — E876 Hypokalemia: Secondary | ICD-10-CM | POA: Diagnosis not present

## 2018-08-14 DIAGNOSIS — Z9049 Acquired absence of other specified parts of digestive tract: Secondary | ICD-10-CM | POA: Diagnosis not present

## 2018-08-14 DIAGNOSIS — G8929 Other chronic pain: Secondary | ICD-10-CM | POA: Diagnosis not present

## 2018-08-14 DIAGNOSIS — I4891 Unspecified atrial fibrillation: Secondary | ICD-10-CM | POA: Diagnosis not present

## 2018-08-14 DIAGNOSIS — M797 Fibromyalgia: Secondary | ICD-10-CM | POA: Diagnosis not present

## 2018-08-14 DIAGNOSIS — Z7982 Long term (current) use of aspirin: Secondary | ICD-10-CM | POA: Diagnosis not present

## 2018-08-15 DIAGNOSIS — Z7982 Long term (current) use of aspirin: Secondary | ICD-10-CM | POA: Diagnosis not present

## 2018-08-15 DIAGNOSIS — M1711 Unilateral primary osteoarthritis, right knee: Secondary | ICD-10-CM | POA: Diagnosis not present

## 2018-08-15 DIAGNOSIS — R262 Difficulty in walking, not elsewhere classified: Secondary | ICD-10-CM | POA: Diagnosis not present

## 2018-08-15 DIAGNOSIS — G8929 Other chronic pain: Secondary | ICD-10-CM | POA: Diagnosis not present

## 2018-08-15 DIAGNOSIS — Z1159 Encounter for screening for other viral diseases: Secondary | ICD-10-CM | POA: Diagnosis not present

## 2018-08-15 DIAGNOSIS — I10 Essential (primary) hypertension: Secondary | ICD-10-CM | POA: Diagnosis not present

## 2018-08-15 DIAGNOSIS — M797 Fibromyalgia: Secondary | ICD-10-CM | POA: Diagnosis not present

## 2018-08-15 DIAGNOSIS — I4891 Unspecified atrial fibrillation: Secondary | ICD-10-CM | POA: Diagnosis not present

## 2018-08-15 DIAGNOSIS — M6281 Muscle weakness (generalized): Secondary | ICD-10-CM | POA: Diagnosis not present

## 2018-08-15 DIAGNOSIS — Z87891 Personal history of nicotine dependence: Secondary | ICD-10-CM | POA: Diagnosis not present

## 2018-08-15 DIAGNOSIS — Z9049 Acquired absence of other specified parts of digestive tract: Secondary | ICD-10-CM | POA: Diagnosis not present

## 2018-08-15 DIAGNOSIS — Z886 Allergy status to analgesic agent status: Secondary | ICD-10-CM | POA: Diagnosis not present

## 2018-08-15 DIAGNOSIS — M545 Low back pain: Secondary | ICD-10-CM | POA: Diagnosis not present

## 2018-08-15 DIAGNOSIS — Z79899 Other long term (current) drug therapy: Secondary | ICD-10-CM | POA: Diagnosis not present

## 2018-08-15 DIAGNOSIS — E876 Hypokalemia: Secondary | ICD-10-CM | POA: Diagnosis not present

## 2018-08-16 DIAGNOSIS — M810 Age-related osteoporosis without current pathological fracture: Secondary | ICD-10-CM | POA: Diagnosis not present

## 2018-08-16 DIAGNOSIS — M545 Low back pain: Secondary | ICD-10-CM | POA: Diagnosis not present

## 2018-08-16 DIAGNOSIS — Z1159 Encounter for screening for other viral diseases: Secondary | ICD-10-CM | POA: Diagnosis not present

## 2018-08-16 DIAGNOSIS — Z791 Long term (current) use of non-steroidal anti-inflammatories (NSAID): Secondary | ICD-10-CM | POA: Diagnosis not present

## 2018-08-16 DIAGNOSIS — Z7982 Long term (current) use of aspirin: Secondary | ICD-10-CM | POA: Diagnosis not present

## 2018-08-16 DIAGNOSIS — M797 Fibromyalgia: Secondary | ICD-10-CM | POA: Diagnosis not present

## 2018-08-16 DIAGNOSIS — Z79899 Other long term (current) drug therapy: Secondary | ICD-10-CM | POA: Diagnosis not present

## 2018-08-16 DIAGNOSIS — Z96651 Presence of right artificial knee joint: Secondary | ICD-10-CM | POA: Diagnosis not present

## 2018-08-16 DIAGNOSIS — Z9049 Acquired absence of other specified parts of digestive tract: Secondary | ICD-10-CM | POA: Diagnosis not present

## 2018-08-16 DIAGNOSIS — E785 Hyperlipidemia, unspecified: Secondary | ICD-10-CM | POA: Diagnosis not present

## 2018-08-16 DIAGNOSIS — G629 Polyneuropathy, unspecified: Secondary | ICD-10-CM | POA: Diagnosis not present

## 2018-08-16 DIAGNOSIS — M1711 Unilateral primary osteoarthritis, right knee: Secondary | ICD-10-CM | POA: Diagnosis not present

## 2018-08-16 DIAGNOSIS — M6281 Muscle weakness (generalized): Secondary | ICD-10-CM | POA: Diagnosis not present

## 2018-08-16 DIAGNOSIS — G8929 Other chronic pain: Secondary | ICD-10-CM | POA: Diagnosis not present

## 2018-08-16 DIAGNOSIS — Z886 Allergy status to analgesic agent status: Secondary | ICD-10-CM | POA: Diagnosis not present

## 2018-08-16 DIAGNOSIS — Z79891 Long term (current) use of opiate analgesic: Secondary | ICD-10-CM | POA: Diagnosis not present

## 2018-08-16 DIAGNOSIS — Z471 Aftercare following joint replacement surgery: Secondary | ICD-10-CM | POA: Diagnosis not present

## 2018-08-16 DIAGNOSIS — Z87891 Personal history of nicotine dependence: Secondary | ICD-10-CM | POA: Diagnosis not present

## 2018-08-16 DIAGNOSIS — I4891 Unspecified atrial fibrillation: Secondary | ICD-10-CM | POA: Diagnosis not present

## 2018-08-16 DIAGNOSIS — I10 Essential (primary) hypertension: Secondary | ICD-10-CM | POA: Diagnosis not present

## 2018-08-16 DIAGNOSIS — E876 Hypokalemia: Secondary | ICD-10-CM | POA: Diagnosis not present

## 2018-08-16 DIAGNOSIS — R262 Difficulty in walking, not elsewhere classified: Secondary | ICD-10-CM | POA: Diagnosis not present

## 2018-08-16 DIAGNOSIS — I482 Chronic atrial fibrillation, unspecified: Secondary | ICD-10-CM | POA: Diagnosis not present

## 2018-08-16 DIAGNOSIS — M25561 Pain in right knee: Secondary | ICD-10-CM | POA: Diagnosis not present

## 2018-08-16 DIAGNOSIS — I480 Paroxysmal atrial fibrillation: Secondary | ICD-10-CM | POA: Diagnosis not present

## 2018-08-17 DIAGNOSIS — M25561 Pain in right knee: Secondary | ICD-10-CM | POA: Diagnosis not present

## 2018-08-17 DIAGNOSIS — I482 Chronic atrial fibrillation, unspecified: Secondary | ICD-10-CM | POA: Diagnosis not present

## 2018-08-17 DIAGNOSIS — M545 Low back pain: Secondary | ICD-10-CM | POA: Diagnosis not present

## 2018-08-18 DIAGNOSIS — Z96651 Presence of right artificial knee joint: Secondary | ICD-10-CM | POA: Insufficient documentation

## 2018-08-25 DIAGNOSIS — M1711 Unilateral primary osteoarthritis, right knee: Secondary | ICD-10-CM | POA: Diagnosis not present

## 2018-09-02 DIAGNOSIS — I480 Paroxysmal atrial fibrillation: Secondary | ICD-10-CM | POA: Diagnosis not present

## 2018-09-02 DIAGNOSIS — E785 Hyperlipidemia, unspecified: Secondary | ICD-10-CM | POA: Diagnosis not present

## 2018-09-02 DIAGNOSIS — Z96651 Presence of right artificial knee joint: Secondary | ICD-10-CM | POA: Diagnosis not present

## 2018-09-02 DIAGNOSIS — I10 Essential (primary) hypertension: Secondary | ICD-10-CM | POA: Diagnosis not present

## 2018-09-02 DIAGNOSIS — G629 Polyneuropathy, unspecified: Secondary | ICD-10-CM | POA: Diagnosis not present

## 2018-09-02 DIAGNOSIS — M545 Low back pain: Secondary | ICD-10-CM | POA: Diagnosis not present

## 2018-09-02 DIAGNOSIS — Z791 Long term (current) use of non-steroidal anti-inflammatories (NSAID): Secondary | ICD-10-CM | POA: Diagnosis not present

## 2018-09-02 DIAGNOSIS — M810 Age-related osteoporosis without current pathological fracture: Secondary | ICD-10-CM | POA: Diagnosis not present

## 2018-09-02 DIAGNOSIS — Z471 Aftercare following joint replacement surgery: Secondary | ICD-10-CM | POA: Diagnosis not present

## 2018-09-02 DIAGNOSIS — Z79891 Long term (current) use of opiate analgesic: Secondary | ICD-10-CM | POA: Diagnosis not present

## 2018-09-03 DIAGNOSIS — M810 Age-related osteoporosis without current pathological fracture: Secondary | ICD-10-CM | POA: Diagnosis not present

## 2018-09-03 DIAGNOSIS — Z79891 Long term (current) use of opiate analgesic: Secondary | ICD-10-CM | POA: Diagnosis not present

## 2018-09-03 DIAGNOSIS — E785 Hyperlipidemia, unspecified: Secondary | ICD-10-CM | POA: Diagnosis not present

## 2018-09-03 DIAGNOSIS — Z471 Aftercare following joint replacement surgery: Secondary | ICD-10-CM | POA: Diagnosis not present

## 2018-09-03 DIAGNOSIS — Z96651 Presence of right artificial knee joint: Secondary | ICD-10-CM | POA: Diagnosis not present

## 2018-09-03 DIAGNOSIS — I10 Essential (primary) hypertension: Secondary | ICD-10-CM | POA: Diagnosis not present

## 2018-09-03 DIAGNOSIS — G629 Polyneuropathy, unspecified: Secondary | ICD-10-CM | POA: Diagnosis not present

## 2018-09-03 DIAGNOSIS — M545 Low back pain: Secondary | ICD-10-CM | POA: Diagnosis not present

## 2018-09-03 DIAGNOSIS — I480 Paroxysmal atrial fibrillation: Secondary | ICD-10-CM | POA: Diagnosis not present

## 2018-09-03 DIAGNOSIS — Z791 Long term (current) use of non-steroidal anti-inflammatories (NSAID): Secondary | ICD-10-CM | POA: Diagnosis not present

## 2018-09-06 DIAGNOSIS — Z79891 Long term (current) use of opiate analgesic: Secondary | ICD-10-CM | POA: Diagnosis not present

## 2018-09-06 DIAGNOSIS — M545 Low back pain: Secondary | ICD-10-CM | POA: Diagnosis not present

## 2018-09-06 DIAGNOSIS — Z791 Long term (current) use of non-steroidal anti-inflammatories (NSAID): Secondary | ICD-10-CM | POA: Diagnosis not present

## 2018-09-06 DIAGNOSIS — G629 Polyneuropathy, unspecified: Secondary | ICD-10-CM | POA: Diagnosis not present

## 2018-09-06 DIAGNOSIS — I10 Essential (primary) hypertension: Secondary | ICD-10-CM | POA: Diagnosis not present

## 2018-09-06 DIAGNOSIS — Z96651 Presence of right artificial knee joint: Secondary | ICD-10-CM | POA: Diagnosis not present

## 2018-09-06 DIAGNOSIS — E785 Hyperlipidemia, unspecified: Secondary | ICD-10-CM | POA: Diagnosis not present

## 2018-09-06 DIAGNOSIS — I480 Paroxysmal atrial fibrillation: Secondary | ICD-10-CM | POA: Diagnosis not present

## 2018-09-06 DIAGNOSIS — M810 Age-related osteoporosis without current pathological fracture: Secondary | ICD-10-CM | POA: Diagnosis not present

## 2018-09-06 DIAGNOSIS — Z471 Aftercare following joint replacement surgery: Secondary | ICD-10-CM | POA: Diagnosis not present

## 2018-09-07 DIAGNOSIS — M179 Osteoarthritis of knee, unspecified: Secondary | ICD-10-CM | POA: Diagnosis not present

## 2018-09-08 DIAGNOSIS — M545 Low back pain: Secondary | ICD-10-CM | POA: Diagnosis not present

## 2018-09-08 DIAGNOSIS — Z79891 Long term (current) use of opiate analgesic: Secondary | ICD-10-CM | POA: Diagnosis not present

## 2018-09-08 DIAGNOSIS — M810 Age-related osteoporosis without current pathological fracture: Secondary | ICD-10-CM | POA: Diagnosis not present

## 2018-09-08 DIAGNOSIS — I10 Essential (primary) hypertension: Secondary | ICD-10-CM | POA: Diagnosis not present

## 2018-09-08 DIAGNOSIS — I480 Paroxysmal atrial fibrillation: Secondary | ICD-10-CM | POA: Diagnosis not present

## 2018-09-08 DIAGNOSIS — Z471 Aftercare following joint replacement surgery: Secondary | ICD-10-CM | POA: Diagnosis not present

## 2018-09-08 DIAGNOSIS — G629 Polyneuropathy, unspecified: Secondary | ICD-10-CM | POA: Diagnosis not present

## 2018-09-08 DIAGNOSIS — E785 Hyperlipidemia, unspecified: Secondary | ICD-10-CM | POA: Diagnosis not present

## 2018-09-08 DIAGNOSIS — Z96651 Presence of right artificial knee joint: Secondary | ICD-10-CM | POA: Diagnosis not present

## 2018-09-08 DIAGNOSIS — Z791 Long term (current) use of non-steroidal anti-inflammatories (NSAID): Secondary | ICD-10-CM | POA: Diagnosis not present

## 2018-09-09 DIAGNOSIS — G629 Polyneuropathy, unspecified: Secondary | ICD-10-CM | POA: Diagnosis not present

## 2018-09-09 DIAGNOSIS — Z791 Long term (current) use of non-steroidal anti-inflammatories (NSAID): Secondary | ICD-10-CM | POA: Diagnosis not present

## 2018-09-09 DIAGNOSIS — I10 Essential (primary) hypertension: Secondary | ICD-10-CM | POA: Diagnosis not present

## 2018-09-09 DIAGNOSIS — Z96651 Presence of right artificial knee joint: Secondary | ICD-10-CM | POA: Diagnosis not present

## 2018-09-09 DIAGNOSIS — Z79891 Long term (current) use of opiate analgesic: Secondary | ICD-10-CM | POA: Diagnosis not present

## 2018-09-09 DIAGNOSIS — E785 Hyperlipidemia, unspecified: Secondary | ICD-10-CM | POA: Diagnosis not present

## 2018-09-09 DIAGNOSIS — M545 Low back pain: Secondary | ICD-10-CM | POA: Diagnosis not present

## 2018-09-09 DIAGNOSIS — Z471 Aftercare following joint replacement surgery: Secondary | ICD-10-CM | POA: Diagnosis not present

## 2018-09-09 DIAGNOSIS — M810 Age-related osteoporosis without current pathological fracture: Secondary | ICD-10-CM | POA: Diagnosis not present

## 2018-09-09 DIAGNOSIS — I480 Paroxysmal atrial fibrillation: Secondary | ICD-10-CM | POA: Diagnosis not present

## 2018-09-13 DIAGNOSIS — E785 Hyperlipidemia, unspecified: Secondary | ICD-10-CM | POA: Diagnosis not present

## 2018-09-13 DIAGNOSIS — G629 Polyneuropathy, unspecified: Secondary | ICD-10-CM | POA: Diagnosis not present

## 2018-09-13 DIAGNOSIS — Z79891 Long term (current) use of opiate analgesic: Secondary | ICD-10-CM | POA: Diagnosis not present

## 2018-09-13 DIAGNOSIS — Z791 Long term (current) use of non-steroidal anti-inflammatories (NSAID): Secondary | ICD-10-CM | POA: Diagnosis not present

## 2018-09-13 DIAGNOSIS — I10 Essential (primary) hypertension: Secondary | ICD-10-CM | POA: Diagnosis not present

## 2018-09-13 DIAGNOSIS — Z96651 Presence of right artificial knee joint: Secondary | ICD-10-CM | POA: Diagnosis not present

## 2018-09-13 DIAGNOSIS — M545 Low back pain: Secondary | ICD-10-CM | POA: Diagnosis not present

## 2018-09-13 DIAGNOSIS — I480 Paroxysmal atrial fibrillation: Secondary | ICD-10-CM | POA: Diagnosis not present

## 2018-09-13 DIAGNOSIS — M810 Age-related osteoporosis without current pathological fracture: Secondary | ICD-10-CM | POA: Diagnosis not present

## 2018-09-13 DIAGNOSIS — Z471 Aftercare following joint replacement surgery: Secondary | ICD-10-CM | POA: Diagnosis not present

## 2018-09-15 DIAGNOSIS — Z96651 Presence of right artificial knee joint: Secondary | ICD-10-CM | POA: Diagnosis not present

## 2018-09-15 DIAGNOSIS — G629 Polyneuropathy, unspecified: Secondary | ICD-10-CM | POA: Diagnosis not present

## 2018-09-15 DIAGNOSIS — I480 Paroxysmal atrial fibrillation: Secondary | ICD-10-CM | POA: Diagnosis not present

## 2018-09-15 DIAGNOSIS — Z471 Aftercare following joint replacement surgery: Secondary | ICD-10-CM | POA: Diagnosis not present

## 2018-09-15 DIAGNOSIS — E785 Hyperlipidemia, unspecified: Secondary | ICD-10-CM | POA: Diagnosis not present

## 2018-09-15 DIAGNOSIS — M545 Low back pain: Secondary | ICD-10-CM | POA: Diagnosis not present

## 2018-09-15 DIAGNOSIS — Z79891 Long term (current) use of opiate analgesic: Secondary | ICD-10-CM | POA: Diagnosis not present

## 2018-09-15 DIAGNOSIS — I10 Essential (primary) hypertension: Secondary | ICD-10-CM | POA: Diagnosis not present

## 2018-09-15 DIAGNOSIS — Z791 Long term (current) use of non-steroidal anti-inflammatories (NSAID): Secondary | ICD-10-CM | POA: Diagnosis not present

## 2018-09-15 DIAGNOSIS — M810 Age-related osteoporosis without current pathological fracture: Secondary | ICD-10-CM | POA: Diagnosis not present

## 2018-09-16 DIAGNOSIS — Z471 Aftercare following joint replacement surgery: Secondary | ICD-10-CM | POA: Diagnosis not present

## 2018-09-16 DIAGNOSIS — I10 Essential (primary) hypertension: Secondary | ICD-10-CM | POA: Diagnosis not present

## 2018-09-16 DIAGNOSIS — M810 Age-related osteoporosis without current pathological fracture: Secondary | ICD-10-CM | POA: Diagnosis not present

## 2018-09-16 DIAGNOSIS — I480 Paroxysmal atrial fibrillation: Secondary | ICD-10-CM | POA: Diagnosis not present

## 2018-09-16 DIAGNOSIS — E785 Hyperlipidemia, unspecified: Secondary | ICD-10-CM | POA: Diagnosis not present

## 2018-09-16 DIAGNOSIS — M545 Low back pain: Secondary | ICD-10-CM | POA: Diagnosis not present

## 2018-09-16 DIAGNOSIS — Z96651 Presence of right artificial knee joint: Secondary | ICD-10-CM | POA: Diagnosis not present

## 2018-09-16 DIAGNOSIS — Z79891 Long term (current) use of opiate analgesic: Secondary | ICD-10-CM | POA: Diagnosis not present

## 2018-09-16 DIAGNOSIS — G629 Polyneuropathy, unspecified: Secondary | ICD-10-CM | POA: Diagnosis not present

## 2018-09-16 DIAGNOSIS — Z791 Long term (current) use of non-steroidal anti-inflammatories (NSAID): Secondary | ICD-10-CM | POA: Diagnosis not present

## 2018-09-20 DIAGNOSIS — E785 Hyperlipidemia, unspecified: Secondary | ICD-10-CM | POA: Diagnosis not present

## 2018-09-20 DIAGNOSIS — Z791 Long term (current) use of non-steroidal anti-inflammatories (NSAID): Secondary | ICD-10-CM | POA: Diagnosis not present

## 2018-09-20 DIAGNOSIS — G629 Polyneuropathy, unspecified: Secondary | ICD-10-CM | POA: Diagnosis not present

## 2018-09-20 DIAGNOSIS — M545 Low back pain: Secondary | ICD-10-CM | POA: Diagnosis not present

## 2018-09-20 DIAGNOSIS — M810 Age-related osteoporosis without current pathological fracture: Secondary | ICD-10-CM | POA: Diagnosis not present

## 2018-09-20 DIAGNOSIS — Z79891 Long term (current) use of opiate analgesic: Secondary | ICD-10-CM | POA: Diagnosis not present

## 2018-09-20 DIAGNOSIS — Z471 Aftercare following joint replacement surgery: Secondary | ICD-10-CM | POA: Diagnosis not present

## 2018-09-20 DIAGNOSIS — Z96651 Presence of right artificial knee joint: Secondary | ICD-10-CM | POA: Diagnosis not present

## 2018-09-20 DIAGNOSIS — I480 Paroxysmal atrial fibrillation: Secondary | ICD-10-CM | POA: Diagnosis not present

## 2018-09-20 DIAGNOSIS — I10 Essential (primary) hypertension: Secondary | ICD-10-CM | POA: Diagnosis not present

## 2018-09-22 DIAGNOSIS — I10 Essential (primary) hypertension: Secondary | ICD-10-CM | POA: Diagnosis not present

## 2018-09-22 DIAGNOSIS — R600 Localized edema: Secondary | ICD-10-CM | POA: Diagnosis not present

## 2018-09-22 DIAGNOSIS — M810 Age-related osteoporosis without current pathological fracture: Secondary | ICD-10-CM | POA: Diagnosis not present

## 2018-09-22 DIAGNOSIS — Z471 Aftercare following joint replacement surgery: Secondary | ICD-10-CM | POA: Diagnosis not present

## 2018-09-22 DIAGNOSIS — Z96651 Presence of right artificial knee joint: Secondary | ICD-10-CM | POA: Diagnosis not present

## 2018-09-22 DIAGNOSIS — G629 Polyneuropathy, unspecified: Secondary | ICD-10-CM | POA: Diagnosis not present

## 2018-09-22 DIAGNOSIS — E785 Hyperlipidemia, unspecified: Secondary | ICD-10-CM | POA: Diagnosis not present

## 2018-09-22 DIAGNOSIS — I480 Paroxysmal atrial fibrillation: Secondary | ICD-10-CM | POA: Diagnosis not present

## 2018-09-22 DIAGNOSIS — M7989 Other specified soft tissue disorders: Secondary | ICD-10-CM | POA: Diagnosis not present

## 2018-09-22 DIAGNOSIS — Z791 Long term (current) use of non-steroidal anti-inflammatories (NSAID): Secondary | ICD-10-CM | POA: Diagnosis not present

## 2018-09-22 DIAGNOSIS — Z79891 Long term (current) use of opiate analgesic: Secondary | ICD-10-CM | POA: Diagnosis not present

## 2018-09-22 DIAGNOSIS — M79661 Pain in right lower leg: Secondary | ICD-10-CM | POA: Diagnosis not present

## 2018-09-22 DIAGNOSIS — M545 Low back pain: Secondary | ICD-10-CM | POA: Diagnosis not present

## 2018-09-23 DIAGNOSIS — K210 Gastro-esophageal reflux disease with esophagitis: Secondary | ICD-10-CM | POA: Diagnosis not present

## 2018-09-23 DIAGNOSIS — M545 Low back pain: Secondary | ICD-10-CM | POA: Diagnosis not present

## 2018-09-23 DIAGNOSIS — I10 Essential (primary) hypertension: Secondary | ICD-10-CM | POA: Diagnosis not present

## 2018-10-15 DIAGNOSIS — Z96651 Presence of right artificial knee joint: Secondary | ICD-10-CM | POA: Diagnosis not present

## 2018-10-15 DIAGNOSIS — M1711 Unilateral primary osteoarthritis, right knee: Secondary | ICD-10-CM | POA: Diagnosis not present

## 2018-12-06 DIAGNOSIS — J0100 Acute maxillary sinusitis, unspecified: Secondary | ICD-10-CM | POA: Diagnosis not present

## 2019-01-04 DIAGNOSIS — I10 Essential (primary) hypertension: Secondary | ICD-10-CM | POA: Diagnosis not present

## 2019-01-04 DIAGNOSIS — M545 Low back pain: Secondary | ICD-10-CM | POA: Diagnosis not present

## 2019-01-04 DIAGNOSIS — K219 Gastro-esophageal reflux disease without esophagitis: Secondary | ICD-10-CM | POA: Diagnosis not present

## 2019-01-04 DIAGNOSIS — E7849 Other hyperlipidemia: Secondary | ICD-10-CM | POA: Diagnosis not present

## 2019-01-17 DIAGNOSIS — J029 Acute pharyngitis, unspecified: Secondary | ICD-10-CM | POA: Diagnosis not present

## 2019-03-08 DIAGNOSIS — R1110 Vomiting, unspecified: Secondary | ICD-10-CM | POA: Diagnosis not present

## 2019-03-08 DIAGNOSIS — M797 Fibromyalgia: Secondary | ICD-10-CM | POA: Diagnosis not present

## 2019-03-08 DIAGNOSIS — R112 Nausea with vomiting, unspecified: Secondary | ICD-10-CM | POA: Diagnosis not present

## 2019-03-08 DIAGNOSIS — Z79899 Other long term (current) drug therapy: Secondary | ICD-10-CM | POA: Diagnosis not present

## 2019-03-08 DIAGNOSIS — R0989 Other specified symptoms and signs involving the circulatory and respiratory systems: Secondary | ICD-10-CM | POA: Diagnosis not present

## 2019-03-08 DIAGNOSIS — R110 Nausea: Secondary | ICD-10-CM | POA: Diagnosis not present

## 2019-03-08 DIAGNOSIS — Z888 Allergy status to other drugs, medicaments and biological substances status: Secondary | ICD-10-CM | POA: Diagnosis not present

## 2019-03-08 DIAGNOSIS — M1990 Unspecified osteoarthritis, unspecified site: Secondary | ICD-10-CM | POA: Diagnosis not present

## 2019-03-08 DIAGNOSIS — Z87891 Personal history of nicotine dependence: Secondary | ICD-10-CM | POA: Diagnosis not present

## 2019-03-08 DIAGNOSIS — R197 Diarrhea, unspecified: Secondary | ICD-10-CM | POA: Diagnosis not present

## 2019-03-08 DIAGNOSIS — R61 Generalized hyperhidrosis: Secondary | ICD-10-CM | POA: Diagnosis not present

## 2019-03-08 DIAGNOSIS — R000 Tachycardia, unspecified: Secondary | ICD-10-CM | POA: Diagnosis not present

## 2019-03-08 DIAGNOSIS — R1032 Left lower quadrant pain: Secondary | ICD-10-CM | POA: Diagnosis not present

## 2019-03-08 DIAGNOSIS — I700 Atherosclerosis of aorta: Secondary | ICD-10-CM | POA: Diagnosis not present

## 2019-03-08 DIAGNOSIS — R079 Chest pain, unspecified: Secondary | ICD-10-CM | POA: Diagnosis not present

## 2019-03-16 DIAGNOSIS — Z0000 Encounter for general adult medical examination without abnormal findings: Secondary | ICD-10-CM | POA: Diagnosis not present

## 2019-03-16 DIAGNOSIS — I209 Angina pectoris, unspecified: Secondary | ICD-10-CM | POA: Diagnosis not present

## 2019-04-05 DIAGNOSIS — I209 Angina pectoris, unspecified: Secondary | ICD-10-CM | POA: Diagnosis not present

## 2019-04-05 DIAGNOSIS — K219 Gastro-esophageal reflux disease without esophagitis: Secondary | ICD-10-CM | POA: Diagnosis not present

## 2019-04-05 DIAGNOSIS — M545 Low back pain: Secondary | ICD-10-CM | POA: Diagnosis not present

## 2019-04-05 DIAGNOSIS — Z131 Encounter for screening for diabetes mellitus: Secondary | ICD-10-CM | POA: Diagnosis not present

## 2019-04-05 DIAGNOSIS — I10 Essential (primary) hypertension: Secondary | ICD-10-CM | POA: Diagnosis not present

## 2019-04-05 DIAGNOSIS — E7849 Other hyperlipidemia: Secondary | ICD-10-CM | POA: Diagnosis not present

## 2019-05-02 DIAGNOSIS — S61411A Laceration without foreign body of right hand, initial encounter: Secondary | ICD-10-CM | POA: Diagnosis not present

## 2019-05-02 DIAGNOSIS — M542 Cervicalgia: Secondary | ICD-10-CM | POA: Diagnosis not present

## 2019-05-02 DIAGNOSIS — R0902 Hypoxemia: Secondary | ICD-10-CM | POA: Diagnosis not present

## 2019-05-02 DIAGNOSIS — M797 Fibromyalgia: Secondary | ICD-10-CM | POA: Diagnosis not present

## 2019-05-02 DIAGNOSIS — Z20822 Contact with and (suspected) exposure to covid-19: Secondary | ICD-10-CM | POA: Diagnosis not present

## 2019-05-02 DIAGNOSIS — I4891 Unspecified atrial fibrillation: Secondary | ICD-10-CM | POA: Diagnosis not present

## 2019-05-02 DIAGNOSIS — E876 Hypokalemia: Secondary | ICD-10-CM | POA: Diagnosis not present

## 2019-05-02 DIAGNOSIS — R000 Tachycardia, unspecified: Secondary | ICD-10-CM | POA: Diagnosis not present

## 2019-05-02 DIAGNOSIS — Z79899 Other long term (current) drug therapy: Secondary | ICD-10-CM | POA: Diagnosis not present

## 2019-05-02 DIAGNOSIS — R402142 Coma scale, eyes open, spontaneous, at arrival to emergency department: Secondary | ICD-10-CM | POA: Diagnosis not present

## 2019-05-02 DIAGNOSIS — R519 Headache, unspecified: Secondary | ICD-10-CM | POA: Diagnosis not present

## 2019-05-02 DIAGNOSIS — M25562 Pain in left knee: Secondary | ICD-10-CM | POA: Diagnosis not present

## 2019-05-02 DIAGNOSIS — S2222XA Fracture of body of sternum, initial encounter for closed fracture: Secondary | ICD-10-CM | POA: Diagnosis not present

## 2019-05-02 DIAGNOSIS — Z87891 Personal history of nicotine dependence: Secondary | ICD-10-CM | POA: Diagnosis not present

## 2019-05-02 DIAGNOSIS — Z7983 Long term (current) use of bisphosphonates: Secondary | ICD-10-CM | POA: Diagnosis not present

## 2019-05-02 DIAGNOSIS — S3981XA Other specified injuries of abdomen, initial encounter: Secondary | ICD-10-CM | POA: Diagnosis not present

## 2019-05-02 DIAGNOSIS — S301XXA Contusion of abdominal wall, initial encounter: Secondary | ICD-10-CM | POA: Diagnosis not present

## 2019-05-02 DIAGNOSIS — R0602 Shortness of breath: Secondary | ICD-10-CM | POA: Diagnosis not present

## 2019-05-02 DIAGNOSIS — S9001XA Contusion of right ankle, initial encounter: Secondary | ICD-10-CM | POA: Diagnosis not present

## 2019-05-02 DIAGNOSIS — M545 Low back pain: Secondary | ICD-10-CM | POA: Diagnosis not present

## 2019-05-02 DIAGNOSIS — Z96653 Presence of artificial knee joint, bilateral: Secondary | ICD-10-CM | POA: Diagnosis not present

## 2019-05-02 DIAGNOSIS — S2220XA Unspecified fracture of sternum, initial encounter for closed fracture: Secondary | ICD-10-CM | POA: Diagnosis not present

## 2019-05-02 DIAGNOSIS — R0789 Other chest pain: Secondary | ICD-10-CM | POA: Diagnosis not present

## 2019-05-02 DIAGNOSIS — S2001XA Contusion of right breast, initial encounter: Secondary | ICD-10-CM | POA: Diagnosis not present

## 2019-05-02 DIAGNOSIS — M25512 Pain in left shoulder: Secondary | ICD-10-CM | POA: Diagnosis not present

## 2019-05-02 DIAGNOSIS — R402362 Coma scale, best motor response, obeys commands, at arrival to emergency department: Secondary | ICD-10-CM | POA: Diagnosis not present

## 2019-05-02 DIAGNOSIS — I959 Hypotension, unspecified: Secondary | ICD-10-CM | POA: Diagnosis not present

## 2019-05-02 DIAGNOSIS — I480 Paroxysmal atrial fibrillation: Secondary | ICD-10-CM | POA: Diagnosis not present

## 2019-05-02 DIAGNOSIS — Z85820 Personal history of malignant melanoma of skin: Secondary | ICD-10-CM | POA: Diagnosis not present

## 2019-05-02 DIAGNOSIS — G8929 Other chronic pain: Secondary | ICD-10-CM | POA: Diagnosis not present

## 2019-05-02 DIAGNOSIS — S6991XA Unspecified injury of right wrist, hand and finger(s), initial encounter: Secondary | ICD-10-CM | POA: Diagnosis not present

## 2019-05-02 DIAGNOSIS — R402252 Coma scale, best verbal response, oriented, at arrival to emergency department: Secondary | ICD-10-CM | POA: Diagnosis not present

## 2019-05-02 DIAGNOSIS — I10 Essential (primary) hypertension: Secondary | ICD-10-CM | POA: Diagnosis not present

## 2019-05-02 DIAGNOSIS — S299XXA Unspecified injury of thorax, initial encounter: Secondary | ICD-10-CM | POA: Diagnosis not present

## 2019-05-02 DIAGNOSIS — M549 Dorsalgia, unspecified: Secondary | ICD-10-CM | POA: Diagnosis not present

## 2019-05-02 DIAGNOSIS — M25561 Pain in right knee: Secondary | ICD-10-CM | POA: Diagnosis not present

## 2019-05-02 DIAGNOSIS — D649 Anemia, unspecified: Secondary | ICD-10-CM | POA: Diagnosis not present

## 2019-05-02 DIAGNOSIS — I4510 Unspecified right bundle-branch block: Secondary | ICD-10-CM | POA: Diagnosis not present

## 2019-05-02 DIAGNOSIS — R531 Weakness: Secondary | ICD-10-CM | POA: Diagnosis not present

## 2019-05-02 DIAGNOSIS — R079 Chest pain, unspecified: Secondary | ICD-10-CM | POA: Diagnosis not present

## 2019-05-02 DIAGNOSIS — N179 Acute kidney failure, unspecified: Secondary | ICD-10-CM | POA: Diagnosis not present

## 2019-05-04 DIAGNOSIS — I10 Essential (primary) hypertension: Secondary | ICD-10-CM | POA: Diagnosis not present

## 2019-05-04 DIAGNOSIS — R0902 Hypoxemia: Secondary | ICD-10-CM | POA: Diagnosis not present

## 2019-05-04 DIAGNOSIS — Z87891 Personal history of nicotine dependence: Secondary | ICD-10-CM | POA: Diagnosis not present

## 2019-05-04 DIAGNOSIS — Z20822 Contact with and (suspected) exposure to covid-19: Secondary | ICD-10-CM | POA: Diagnosis not present

## 2019-05-04 DIAGNOSIS — R531 Weakness: Secondary | ICD-10-CM | POA: Diagnosis not present

## 2019-05-04 DIAGNOSIS — S2220XA Unspecified fracture of sternum, initial encounter for closed fracture: Secondary | ICD-10-CM | POA: Insufficient documentation

## 2019-05-04 DIAGNOSIS — I480 Paroxysmal atrial fibrillation: Secondary | ICD-10-CM | POA: Diagnosis not present

## 2019-05-04 DIAGNOSIS — Z7983 Long term (current) use of bisphosphonates: Secondary | ICD-10-CM | POA: Diagnosis not present

## 2019-05-04 DIAGNOSIS — M545 Low back pain: Secondary | ICD-10-CM | POA: Diagnosis not present

## 2019-05-04 DIAGNOSIS — M797 Fibromyalgia: Secondary | ICD-10-CM | POA: Diagnosis not present

## 2019-05-04 DIAGNOSIS — N179 Acute kidney failure, unspecified: Secondary | ICD-10-CM | POA: Diagnosis not present

## 2019-05-04 DIAGNOSIS — D649 Anemia, unspecified: Secondary | ICD-10-CM | POA: Diagnosis not present

## 2019-05-04 DIAGNOSIS — S2001XA Contusion of right breast, initial encounter: Secondary | ICD-10-CM | POA: Insufficient documentation

## 2019-05-04 DIAGNOSIS — Z85820 Personal history of malignant melanoma of skin: Secondary | ICD-10-CM | POA: Diagnosis not present

## 2019-05-04 DIAGNOSIS — G8929 Other chronic pain: Secondary | ICD-10-CM | POA: Diagnosis not present

## 2019-05-04 DIAGNOSIS — E876 Hypokalemia: Secondary | ICD-10-CM | POA: Diagnosis not present

## 2019-05-05 DIAGNOSIS — I491 Atrial premature depolarization: Secondary | ICD-10-CM | POA: Diagnosis not present

## 2019-05-05 DIAGNOSIS — I4510 Unspecified right bundle-branch block: Secondary | ICD-10-CM | POA: Diagnosis not present

## 2019-05-05 DIAGNOSIS — Z87891 Personal history of nicotine dependence: Secondary | ICD-10-CM | POA: Diagnosis not present

## 2019-05-05 DIAGNOSIS — R0902 Hypoxemia: Secondary | ICD-10-CM | POA: Diagnosis not present

## 2019-05-05 DIAGNOSIS — G8929 Other chronic pain: Secondary | ICD-10-CM | POA: Diagnosis not present

## 2019-05-05 DIAGNOSIS — Z85820 Personal history of malignant melanoma of skin: Secondary | ICD-10-CM | POA: Diagnosis not present

## 2019-05-05 DIAGNOSIS — N179 Acute kidney failure, unspecified: Secondary | ICD-10-CM | POA: Diagnosis not present

## 2019-05-05 DIAGNOSIS — Z7983 Long term (current) use of bisphosphonates: Secondary | ICD-10-CM | POA: Diagnosis not present

## 2019-05-05 DIAGNOSIS — I480 Paroxysmal atrial fibrillation: Secondary | ICD-10-CM | POA: Diagnosis not present

## 2019-05-05 DIAGNOSIS — Z20822 Contact with and (suspected) exposure to covid-19: Secondary | ICD-10-CM | POA: Diagnosis not present

## 2019-05-05 DIAGNOSIS — M545 Low back pain: Secondary | ICD-10-CM | POA: Diagnosis not present

## 2019-05-05 DIAGNOSIS — E876 Hypokalemia: Secondary | ICD-10-CM | POA: Diagnosis not present

## 2019-05-05 DIAGNOSIS — D649 Anemia, unspecified: Secondary | ICD-10-CM | POA: Diagnosis not present

## 2019-05-05 DIAGNOSIS — M797 Fibromyalgia: Secondary | ICD-10-CM | POA: Diagnosis not present

## 2019-05-05 DIAGNOSIS — I10 Essential (primary) hypertension: Secondary | ICD-10-CM | POA: Diagnosis not present

## 2019-05-12 DIAGNOSIS — S42012A Anterior displaced fracture of sternal end of left clavicle, initial encounter for closed fracture: Secondary | ICD-10-CM | POA: Diagnosis not present

## 2019-05-16 DIAGNOSIS — R0902 Hypoxemia: Secondary | ICD-10-CM | POA: Diagnosis not present

## 2019-06-30 DIAGNOSIS — M545 Low back pain: Secondary | ICD-10-CM | POA: Diagnosis not present

## 2019-06-30 DIAGNOSIS — K219 Gastro-esophageal reflux disease without esophagitis: Secondary | ICD-10-CM | POA: Diagnosis not present

## 2019-06-30 DIAGNOSIS — Z0000 Encounter for general adult medical examination without abnormal findings: Secondary | ICD-10-CM | POA: Diagnosis not present

## 2019-06-30 DIAGNOSIS — I10 Essential (primary) hypertension: Secondary | ICD-10-CM | POA: Diagnosis not present

## 2019-06-30 DIAGNOSIS — E7849 Other hyperlipidemia: Secondary | ICD-10-CM | POA: Diagnosis not present

## 2019-07-23 DIAGNOSIS — I2510 Atherosclerotic heart disease of native coronary artery without angina pectoris: Secondary | ICD-10-CM | POA: Diagnosis not present

## 2019-07-23 DIAGNOSIS — I4891 Unspecified atrial fibrillation: Secondary | ICD-10-CM | POA: Diagnosis not present

## 2019-07-23 DIAGNOSIS — M47812 Spondylosis without myelopathy or radiculopathy, cervical region: Secondary | ICD-10-CM | POA: Diagnosis not present

## 2019-07-23 DIAGNOSIS — S79911A Unspecified injury of right hip, initial encounter: Secondary | ICD-10-CM | POA: Diagnosis not present

## 2019-07-23 DIAGNOSIS — Z79899 Other long term (current) drug therapy: Secondary | ICD-10-CM | POA: Diagnosis not present

## 2019-07-23 DIAGNOSIS — S0990XA Unspecified injury of head, initial encounter: Secondary | ICD-10-CM | POA: Diagnosis not present

## 2019-07-23 DIAGNOSIS — S199XXA Unspecified injury of neck, initial encounter: Secondary | ICD-10-CM | POA: Diagnosis not present

## 2019-07-23 DIAGNOSIS — R001 Bradycardia, unspecified: Secondary | ICD-10-CM | POA: Diagnosis not present

## 2019-07-23 DIAGNOSIS — R400 Somnolence: Secondary | ICD-10-CM | POA: Diagnosis not present

## 2019-07-23 DIAGNOSIS — I700 Atherosclerosis of aorta: Secondary | ICD-10-CM | POA: Diagnosis not present

## 2019-07-23 DIAGNOSIS — W19XXXA Unspecified fall, initial encounter: Secondary | ICD-10-CM | POA: Diagnosis not present

## 2019-07-23 DIAGNOSIS — Z9049 Acquired absence of other specified parts of digestive tract: Secondary | ICD-10-CM | POA: Diagnosis not present

## 2019-07-23 DIAGNOSIS — E876 Hypokalemia: Secondary | ICD-10-CM | POA: Diagnosis not present

## 2019-07-23 DIAGNOSIS — S2241XA Multiple fractures of ribs, right side, initial encounter for closed fracture: Secondary | ICD-10-CM | POA: Diagnosis not present

## 2019-07-23 DIAGNOSIS — I10 Essential (primary) hypertension: Secondary | ICD-10-CM | POA: Diagnosis not present

## 2019-07-23 DIAGNOSIS — S3991XA Unspecified injury of abdomen, initial encounter: Secondary | ICD-10-CM | POA: Diagnosis not present

## 2019-07-23 DIAGNOSIS — M797 Fibromyalgia: Secondary | ICD-10-CM | POA: Diagnosis not present

## 2019-07-23 DIAGNOSIS — D1809 Hemangioma of other sites: Secondary | ICD-10-CM | POA: Diagnosis not present

## 2019-07-23 DIAGNOSIS — I440 Atrioventricular block, first degree: Secondary | ICD-10-CM | POA: Diagnosis not present

## 2019-07-23 DIAGNOSIS — M47817 Spondylosis without myelopathy or radiculopathy, lumbosacral region: Secondary | ICD-10-CM | POA: Diagnosis not present

## 2019-07-23 DIAGNOSIS — T40425A Adverse effect of tramadol, initial encounter: Secondary | ICD-10-CM | POA: Diagnosis not present

## 2019-07-23 DIAGNOSIS — I959 Hypotension, unspecified: Secondary | ICD-10-CM | POA: Diagnosis not present

## 2019-08-15 DIAGNOSIS — Z79899 Other long term (current) drug therapy: Secondary | ICD-10-CM | POA: Diagnosis not present

## 2019-08-15 DIAGNOSIS — Y998 Other external cause status: Secondary | ICD-10-CM | POA: Diagnosis not present

## 2019-08-15 DIAGNOSIS — M542 Cervicalgia: Secondary | ICD-10-CM | POA: Diagnosis not present

## 2019-08-15 DIAGNOSIS — S42291A Other displaced fracture of upper end of right humerus, initial encounter for closed fracture: Secondary | ICD-10-CM | POA: Diagnosis not present

## 2019-08-15 DIAGNOSIS — M545 Low back pain: Secondary | ICD-10-CM | POA: Diagnosis not present

## 2019-08-15 DIAGNOSIS — M79641 Pain in right hand: Secondary | ICD-10-CM | POA: Diagnosis not present

## 2019-08-15 DIAGNOSIS — M79672 Pain in left foot: Secondary | ICD-10-CM | POA: Diagnosis not present

## 2019-08-15 DIAGNOSIS — S8251XA Displaced fracture of medial malleolus of right tibia, initial encounter for closed fracture: Secondary | ICD-10-CM | POA: Diagnosis not present

## 2019-08-15 DIAGNOSIS — I10 Essential (primary) hypertension: Secondary | ICD-10-CM | POA: Diagnosis not present

## 2019-08-15 DIAGNOSIS — R6889 Other general symptoms and signs: Secondary | ICD-10-CM | POA: Diagnosis not present

## 2019-08-15 DIAGNOSIS — M79671 Pain in right foot: Secondary | ICD-10-CM | POA: Diagnosis not present

## 2019-08-15 DIAGNOSIS — S51011A Laceration without foreign body of right elbow, initial encounter: Secondary | ICD-10-CM | POA: Diagnosis not present

## 2019-08-15 DIAGNOSIS — R918 Other nonspecific abnormal finding of lung field: Secondary | ICD-10-CM | POA: Diagnosis not present

## 2019-08-15 DIAGNOSIS — M79631 Pain in right forearm: Secondary | ICD-10-CM | POA: Diagnosis not present

## 2019-08-15 DIAGNOSIS — Z743 Need for continuous supervision: Secondary | ICD-10-CM | POA: Diagnosis not present

## 2019-08-15 DIAGNOSIS — M25562 Pain in left knee: Secondary | ICD-10-CM | POA: Diagnosis not present

## 2019-08-15 DIAGNOSIS — W010XXA Fall on same level from slipping, tripping and stumbling without subsequent striking against object, initial encounter: Secondary | ICD-10-CM | POA: Diagnosis not present

## 2019-08-15 DIAGNOSIS — S0990XA Unspecified injury of head, initial encounter: Secondary | ICD-10-CM | POA: Diagnosis not present

## 2019-08-15 DIAGNOSIS — R079 Chest pain, unspecified: Secondary | ICD-10-CM | POA: Diagnosis not present

## 2019-08-15 DIAGNOSIS — S8261XA Displaced fracture of lateral malleolus of right fibula, initial encounter for closed fracture: Secondary | ICD-10-CM | POA: Diagnosis not present

## 2019-08-15 DIAGNOSIS — S42211A Unspecified displaced fracture of surgical neck of right humerus, initial encounter for closed fracture: Secondary | ICD-10-CM | POA: Diagnosis not present

## 2019-08-15 DIAGNOSIS — Z20822 Contact with and (suspected) exposure to covid-19: Secondary | ICD-10-CM | POA: Diagnosis not present

## 2019-08-15 DIAGNOSIS — M25521 Pain in right elbow: Secondary | ICD-10-CM | POA: Diagnosis not present

## 2019-08-15 DIAGNOSIS — R109 Unspecified abdominal pain: Secondary | ICD-10-CM | POA: Diagnosis not present

## 2019-08-15 DIAGNOSIS — M25531 Pain in right wrist: Secondary | ICD-10-CM | POA: Diagnosis not present

## 2019-08-15 DIAGNOSIS — R52 Pain, unspecified: Secondary | ICD-10-CM | POA: Diagnosis not present

## 2019-08-15 DIAGNOSIS — I4891 Unspecified atrial fibrillation: Secondary | ICD-10-CM | POA: Diagnosis not present

## 2019-08-15 DIAGNOSIS — M797 Fibromyalgia: Secondary | ICD-10-CM | POA: Diagnosis not present

## 2019-08-15 DIAGNOSIS — W19XXXA Unspecified fall, initial encounter: Secondary | ICD-10-CM | POA: Diagnosis not present

## 2019-08-15 DIAGNOSIS — S40811A Abrasion of right upper arm, initial encounter: Secondary | ICD-10-CM | POA: Diagnosis not present

## 2019-08-16 DIAGNOSIS — W19XXXA Unspecified fall, initial encounter: Secondary | ICD-10-CM | POA: Diagnosis not present

## 2019-08-16 DIAGNOSIS — S82891A Other fracture of right lower leg, initial encounter for closed fracture: Secondary | ICD-10-CM | POA: Diagnosis not present

## 2019-08-16 DIAGNOSIS — S42211A Unspecified displaced fracture of surgical neck of right humerus, initial encounter for closed fracture: Secondary | ICD-10-CM | POA: Diagnosis not present

## 2019-08-16 DIAGNOSIS — Z7409 Other reduced mobility: Secondary | ICD-10-CM | POA: Diagnosis not present

## 2019-08-17 DIAGNOSIS — S42211A Unspecified displaced fracture of surgical neck of right humerus, initial encounter for closed fracture: Secondary | ICD-10-CM | POA: Diagnosis not present

## 2019-08-17 DIAGNOSIS — Z7409 Other reduced mobility: Secondary | ICD-10-CM | POA: Diagnosis not present

## 2019-08-17 DIAGNOSIS — S82891A Other fracture of right lower leg, initial encounter for closed fracture: Secondary | ICD-10-CM | POA: Diagnosis not present

## 2019-08-17 DIAGNOSIS — W19XXXA Unspecified fall, initial encounter: Secondary | ICD-10-CM | POA: Diagnosis not present

## 2019-08-18 DIAGNOSIS — Z20822 Contact with and (suspected) exposure to covid-19: Secondary | ICD-10-CM | POA: Diagnosis not present

## 2019-08-18 DIAGNOSIS — S42201A Unspecified fracture of upper end of right humerus, initial encounter for closed fracture: Secondary | ICD-10-CM | POA: Diagnosis not present

## 2019-08-18 DIAGNOSIS — M6281 Muscle weakness (generalized): Secondary | ICD-10-CM | POA: Diagnosis not present

## 2019-08-18 DIAGNOSIS — S82891A Other fracture of right lower leg, initial encounter for closed fracture: Secondary | ICD-10-CM | POA: Diagnosis not present

## 2019-08-18 DIAGNOSIS — R2689 Other abnormalities of gait and mobility: Secondary | ICD-10-CM | POA: Diagnosis not present

## 2019-08-18 DIAGNOSIS — Z9181 History of falling: Secondary | ICD-10-CM | POA: Diagnosis not present

## 2019-08-18 DIAGNOSIS — S92154D Nondisplaced avulsion fracture (chip fracture) of right talus, subsequent encounter for fracture with routine healing: Secondary | ICD-10-CM | POA: Diagnosis not present

## 2019-08-18 DIAGNOSIS — Z7409 Other reduced mobility: Secondary | ICD-10-CM | POA: Diagnosis not present

## 2019-08-18 DIAGNOSIS — S42211A Unspecified displaced fracture of surgical neck of right humerus, initial encounter for closed fracture: Secondary | ICD-10-CM | POA: Diagnosis not present

## 2019-08-18 DIAGNOSIS — G629 Polyneuropathy, unspecified: Secondary | ICD-10-CM | POA: Diagnosis not present

## 2019-08-18 DIAGNOSIS — D649 Anemia, unspecified: Secondary | ICD-10-CM | POA: Diagnosis not present

## 2019-08-18 DIAGNOSIS — I4891 Unspecified atrial fibrillation: Secondary | ICD-10-CM | POA: Diagnosis not present

## 2019-08-18 DIAGNOSIS — I10 Essential (primary) hypertension: Secondary | ICD-10-CM | POA: Diagnosis not present

## 2019-08-18 DIAGNOSIS — S8251XA Displaced fracture of medial malleolus of right tibia, initial encounter for closed fracture: Secondary | ICD-10-CM | POA: Diagnosis not present

## 2019-08-18 DIAGNOSIS — M797 Fibromyalgia: Secondary | ICD-10-CM | POA: Diagnosis not present

## 2019-08-18 DIAGNOSIS — I480 Paroxysmal atrial fibrillation: Secondary | ICD-10-CM | POA: Diagnosis not present

## 2019-08-18 DIAGNOSIS — Z79899 Other long term (current) drug therapy: Secondary | ICD-10-CM | POA: Diagnosis not present

## 2019-08-18 DIAGNOSIS — M1710 Unilateral primary osteoarthritis, unspecified knee: Secondary | ICD-10-CM | POA: Diagnosis not present

## 2019-08-18 DIAGNOSIS — S42214D Unspecified nondisplaced fracture of surgical neck of right humerus, subsequent encounter for fracture with routine healing: Secondary | ICD-10-CM | POA: Diagnosis not present

## 2019-08-18 DIAGNOSIS — W19XXXA Unspecified fall, initial encounter: Secondary | ICD-10-CM | POA: Diagnosis not present

## 2019-08-18 DIAGNOSIS — Z743 Need for continuous supervision: Secondary | ICD-10-CM | POA: Diagnosis not present

## 2019-08-18 DIAGNOSIS — M1990 Unspecified osteoarthritis, unspecified site: Secondary | ICD-10-CM | POA: Diagnosis not present

## 2019-08-18 DIAGNOSIS — R29898 Other symptoms and signs involving the musculoskeletal system: Secondary | ICD-10-CM | POA: Diagnosis not present

## 2019-08-19 DIAGNOSIS — M1710 Unilateral primary osteoarthritis, unspecified knee: Secondary | ICD-10-CM | POA: Diagnosis not present

## 2019-08-19 DIAGNOSIS — D649 Anemia, unspecified: Secondary | ICD-10-CM | POA: Diagnosis not present

## 2019-08-19 DIAGNOSIS — S42201A Unspecified fracture of upper end of right humerus, initial encounter for closed fracture: Secondary | ICD-10-CM | POA: Diagnosis not present

## 2019-08-19 DIAGNOSIS — S82891A Other fracture of right lower leg, initial encounter for closed fracture: Secondary | ICD-10-CM | POA: Diagnosis not present

## 2019-09-05 DIAGNOSIS — Z791 Long term (current) use of non-steroidal anti-inflammatories (NSAID): Secondary | ICD-10-CM | POA: Diagnosis not present

## 2019-09-05 DIAGNOSIS — I10 Essential (primary) hypertension: Secondary | ICD-10-CM | POA: Diagnosis not present

## 2019-09-05 DIAGNOSIS — M170 Bilateral primary osteoarthritis of knee: Secondary | ICD-10-CM | POA: Diagnosis not present

## 2019-09-05 DIAGNOSIS — Z9849 Cataract extraction status, unspecified eye: Secondary | ICD-10-CM | POA: Diagnosis not present

## 2019-09-05 DIAGNOSIS — Z85828 Personal history of other malignant neoplasm of skin: Secondary | ICD-10-CM | POA: Diagnosis not present

## 2019-09-05 DIAGNOSIS — S8261XD Displaced fracture of lateral malleolus of right fibula, subsequent encounter for closed fracture with routine healing: Secondary | ICD-10-CM | POA: Diagnosis not present

## 2019-09-05 DIAGNOSIS — D62 Acute posthemorrhagic anemia: Secondary | ICD-10-CM | POA: Diagnosis not present

## 2019-09-05 DIAGNOSIS — Z79891 Long term (current) use of opiate analgesic: Secondary | ICD-10-CM | POA: Diagnosis not present

## 2019-09-05 DIAGNOSIS — M797 Fibromyalgia: Secondary | ICD-10-CM | POA: Diagnosis not present

## 2019-09-05 DIAGNOSIS — W010XXD Fall on same level from slipping, tripping and stumbling without subsequent striking against object, subsequent encounter: Secondary | ICD-10-CM | POA: Diagnosis not present

## 2019-09-05 DIAGNOSIS — S42211D Unspecified displaced fracture of surgical neck of right humerus, subsequent encounter for fracture with routine healing: Secondary | ICD-10-CM | POA: Diagnosis not present

## 2019-09-05 DIAGNOSIS — I4891 Unspecified atrial fibrillation: Secondary | ICD-10-CM | POA: Diagnosis not present

## 2019-09-08 ENCOUNTER — Encounter: Payer: Self-pay | Admitting: *Deleted

## 2019-09-08 ENCOUNTER — Other Ambulatory Visit: Payer: Self-pay | Admitting: *Deleted

## 2019-09-08 DIAGNOSIS — S8261XD Displaced fracture of lateral malleolus of right fibula, subsequent encounter for closed fracture with routine healing: Secondary | ICD-10-CM | POA: Diagnosis not present

## 2019-09-08 DIAGNOSIS — I10 Essential (primary) hypertension: Secondary | ICD-10-CM | POA: Diagnosis not present

## 2019-09-08 DIAGNOSIS — M170 Bilateral primary osteoarthritis of knee: Secondary | ICD-10-CM | POA: Diagnosis not present

## 2019-09-08 DIAGNOSIS — I4891 Unspecified atrial fibrillation: Secondary | ICD-10-CM | POA: Diagnosis not present

## 2019-09-08 DIAGNOSIS — D62 Acute posthemorrhagic anemia: Secondary | ICD-10-CM | POA: Diagnosis not present

## 2019-09-08 DIAGNOSIS — Z85828 Personal history of other malignant neoplasm of skin: Secondary | ICD-10-CM | POA: Diagnosis not present

## 2019-09-08 DIAGNOSIS — Z79891 Long term (current) use of opiate analgesic: Secondary | ICD-10-CM | POA: Diagnosis not present

## 2019-09-08 DIAGNOSIS — Z9849 Cataract extraction status, unspecified eye: Secondary | ICD-10-CM | POA: Diagnosis not present

## 2019-09-08 DIAGNOSIS — W010XXD Fall on same level from slipping, tripping and stumbling without subsequent striking against object, subsequent encounter: Secondary | ICD-10-CM | POA: Diagnosis not present

## 2019-09-08 DIAGNOSIS — S42211D Unspecified displaced fracture of surgical neck of right humerus, subsequent encounter for fracture with routine healing: Secondary | ICD-10-CM | POA: Diagnosis not present

## 2019-09-08 DIAGNOSIS — Z791 Long term (current) use of non-steroidal anti-inflammatories (NSAID): Secondary | ICD-10-CM | POA: Diagnosis not present

## 2019-09-08 DIAGNOSIS — M797 Fibromyalgia: Secondary | ICD-10-CM | POA: Diagnosis not present

## 2019-09-08 NOTE — Patient Outreach (Signed)
[place] [facility]) Care Management  09/08/2019  Langley Flatley 05/04/1945 [id number]   RED ON EMMI ALERT - General Discharge Day # 1 Date: 8/18 Red Alert Reason: Did not receive discharge papers   Outreach attempt #1, successful.  Identity verified.  This care manager introduced self and stated purpose of call.  [facility] care management services explained.     Social: Member reports living with her parents and her son.  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  Since then, she has been in hospital/rehab and her son has not been caring for all of them.  State she is improving, but still having some pain in her arm.  She is hopeful that she will continue to improve and gain her complete independence back.  State she was not provided discharge papers from rehab center but has continued to take all of her pre-hospital medications.   Conditions: Per chart, has history of A-fib, depression, and ACS.  Medications: Reviewed with member, denies need for management or financial assistance.  She will take them with her to her next PCP appointment for review.  Appointments: Will follow up with PCP on 8/23, son will provide transportation.  Denies being active with cardiologist, state her PCP manages all of her health conditions.  Denies any urgent concerns at this time, encouraged to contact this care manager with questions.  Plan: RN CM will follow up with member within the next week.  Goals Addressed              This Visit's Progress   .  To get back to how I was: Independent and caring for parents (pt-stated)        CARE PLAN ENTRY (see longitudinal plan of care for additional care plan information)  Current Barriers:  Marland Kitchen Knowledge Deficits related to fall precautions . Decreased adherence to prescribed treatment for fall prevention   Clinical Goal(s):  Marland Kitchen Over the next 28 days, patient will demonstrate improved adherence to  prescribed treatment plan for decreasing falls as evidenced by patient reporting and review of EMR . Over the next 28days, patient will verbalize using fall risk reduction strategies discussed . Over the next 31 days, patient will not experience additional falls   Interventions:  . Provided written and verbal education re: Potential causes of falls and Fall prevention strategies . Reviewed medications and discussed potential side effects of medications such as dizziness and frequent urination . Assessed for s/s of orthostatic hypotension . Assessed for falls since last encounter. . Assessed patients knowledge of fall risk prevention secondary to previously provided education. . Assessed working status of life alert bracelet and patient adherence . Provided patient information for fall alert systems . Provided education to patient re: fall prevention . Discussed plans with patient for ongoing care management follow up and provided patient with direct contact information for care management team . Reviewed scheduled/upcoming provider appointments including:   Patient Self Care Activities:  . Utilize sling and cane (assistive device) appropriately with all ambulation . De-clutter walkways . Change positions slowly . Wear secure fitting shoes at all times with ambulation . Utilize home lighting for dim lit areas . Have self and pet awareness at all times  Plan: . CCM RN CM will follow up in the next week   Initial goal documentation       Valente David, RN, MSN [place] [phone]

## 2019-09-12 DIAGNOSIS — Z791 Long term (current) use of non-steroidal anti-inflammatories (NSAID): Secondary | ICD-10-CM | POA: Diagnosis not present

## 2019-09-12 DIAGNOSIS — I10 Essential (primary) hypertension: Secondary | ICD-10-CM | POA: Diagnosis not present

## 2019-09-12 DIAGNOSIS — S8261XD Displaced fracture of lateral malleolus of right fibula, subsequent encounter for closed fracture with routine healing: Secondary | ICD-10-CM | POA: Diagnosis not present

## 2019-09-12 DIAGNOSIS — M797 Fibromyalgia: Secondary | ICD-10-CM | POA: Diagnosis not present

## 2019-09-12 DIAGNOSIS — S42211D Unspecified displaced fracture of surgical neck of right humerus, subsequent encounter for fracture with routine healing: Secondary | ICD-10-CM | POA: Diagnosis not present

## 2019-09-12 DIAGNOSIS — W010XXD Fall on same level from slipping, tripping and stumbling without subsequent striking against object, subsequent encounter: Secondary | ICD-10-CM | POA: Diagnosis not present

## 2019-09-12 DIAGNOSIS — Z79891 Long term (current) use of opiate analgesic: Secondary | ICD-10-CM | POA: Diagnosis not present

## 2019-09-12 DIAGNOSIS — M170 Bilateral primary osteoarthritis of knee: Secondary | ICD-10-CM | POA: Diagnosis not present

## 2019-09-12 DIAGNOSIS — Z9849 Cataract extraction status, unspecified eye: Secondary | ICD-10-CM | POA: Diagnosis not present

## 2019-09-12 DIAGNOSIS — S42431A Displaced fracture (avulsion) of lateral epicondyle of right humerus, initial encounter for closed fracture: Secondary | ICD-10-CM | POA: Diagnosis not present

## 2019-09-12 DIAGNOSIS — D62 Acute posthemorrhagic anemia: Secondary | ICD-10-CM | POA: Diagnosis not present

## 2019-09-12 DIAGNOSIS — I4891 Unspecified atrial fibrillation: Secondary | ICD-10-CM | POA: Diagnosis not present

## 2019-09-12 DIAGNOSIS — Z85828 Personal history of other malignant neoplasm of skin: Secondary | ICD-10-CM | POA: Diagnosis not present

## 2019-09-13 ENCOUNTER — Other Ambulatory Visit: Payer: Self-pay | Admitting: *Deleted

## 2019-09-13 DIAGNOSIS — S8261XD Displaced fracture of lateral malleolus of right fibula, subsequent encounter for closed fracture with routine healing: Secondary | ICD-10-CM | POA: Diagnosis not present

## 2019-09-13 DIAGNOSIS — Z791 Long term (current) use of non-steroidal anti-inflammatories (NSAID): Secondary | ICD-10-CM | POA: Diagnosis not present

## 2019-09-13 DIAGNOSIS — D62 Acute posthemorrhagic anemia: Secondary | ICD-10-CM | POA: Diagnosis not present

## 2019-09-13 DIAGNOSIS — M170 Bilateral primary osteoarthritis of knee: Secondary | ICD-10-CM | POA: Diagnosis not present

## 2019-09-13 DIAGNOSIS — Z79891 Long term (current) use of opiate analgesic: Secondary | ICD-10-CM | POA: Diagnosis not present

## 2019-09-13 DIAGNOSIS — Z9849 Cataract extraction status, unspecified eye: Secondary | ICD-10-CM | POA: Diagnosis not present

## 2019-09-13 DIAGNOSIS — Z85828 Personal history of other malignant neoplasm of skin: Secondary | ICD-10-CM | POA: Diagnosis not present

## 2019-09-13 DIAGNOSIS — M797 Fibromyalgia: Secondary | ICD-10-CM | POA: Diagnosis not present

## 2019-09-13 DIAGNOSIS — S42211D Unspecified displaced fracture of surgical neck of right humerus, subsequent encounter for fracture with routine healing: Secondary | ICD-10-CM | POA: Diagnosis not present

## 2019-09-13 DIAGNOSIS — I10 Essential (primary) hypertension: Secondary | ICD-10-CM | POA: Diagnosis not present

## 2019-09-13 DIAGNOSIS — W010XXD Fall on same level from slipping, tripping and stumbling without subsequent striking against object, subsequent encounter: Secondary | ICD-10-CM | POA: Diagnosis not present

## 2019-09-13 DIAGNOSIS — I4891 Unspecified atrial fibrillation: Secondary | ICD-10-CM | POA: Diagnosis not present

## 2019-09-13 NOTE — Patient Outreach (Signed)
[name]) Care Management  09/13/2019  Mary Weeks 12-Aug-1945 [id number]   RED ON EMMI ALERT - General Discharge Day # 4 Date: 8/21 Red Alert Reason: Feeling sad/hopeless/anxious/empty   Weekly transition of care call placed to member, also called to inquire about above noted alert.  She report she is doing "pretty good."  State she had been feeling down lately due to having to depend on her son instead of doing more for herself and her parents (she is their caregiver).  Feel she is improving, mood is better, on Buspar and Prozac.  She is active with home health for PT and OT.  Was seen by her PCP, denies any major concerns.  PCP reportedly wanted her to see ortho specialist, appointment scheduled for 9/13.  Denies any urgent concerns, encouraged to contact this care manager with questions.  Agrees to follow up within the next week.    Plan: RN CM will follow up with member within the next week.  Goals Addressed              This Visit's Progress   .  To get back to how I was: Independent and caring for parents (pt-stated)   On track     [place] (see longitudinal plan of care for additional care plan information)  Current Barriers:  Marland Kitchen Knowledge Deficits related to fall precautions . Decreased adherence to prescribed treatment for fall prevention   Clinical Goal(s):  Marland Kitchen Over the next 28 days, patient will demonstrate improved adherence to prescribed treatment plan for decreasing falls as evidenced by patient reporting and review of EMR . Over the next 28days, patient will verbalize using fall risk reduction strategies discussed . Over the next 31 days, patient will not experience additional falls   Interventions:  . Provided written and verbal education re: Potential causes of falls and Fall prevention strategies . Reviewed medications and discussed potential side effects of medications such as dizziness and frequent urination . Assessed for s/s  of orthostatic hypotension . Assessed for falls since last encounter. . Assessed patients knowledge of fall risk prevention secondary to previously provided education. . Assessed working status of life alert bracelet and patient adherence . Provided patient information for fall alert systems . Provided education to patient re: fall prevention . Discussed plans with patient for ongoing care management follow up and provided patient with direct contact information for care management team . Reviewed scheduled/upcoming provider appointments including:   Patient Self Care Activities:  . Utilize sling and cane (assistive device) appropriately with all ambulation . De-clutter walkways . Change positions slowly . Wear secure fitting shoes at all times with ambulation . Utilize home lighting for dim lit areas . Have self and pet awareness at all times  Plan: . CCM RN CM will follow up in the next week   Initial goal documentation       Mary David, RN, MSN [name] [phone]

## 2019-09-15 DIAGNOSIS — D62 Acute posthemorrhagic anemia: Secondary | ICD-10-CM | POA: Diagnosis not present

## 2019-09-15 DIAGNOSIS — S42211D Unspecified displaced fracture of surgical neck of right humerus, subsequent encounter for fracture with routine healing: Secondary | ICD-10-CM | POA: Diagnosis not present

## 2019-09-15 DIAGNOSIS — W010XXD Fall on same level from slipping, tripping and stumbling without subsequent striking against object, subsequent encounter: Secondary | ICD-10-CM | POA: Diagnosis not present

## 2019-09-15 DIAGNOSIS — Z85828 Personal history of other malignant neoplasm of skin: Secondary | ICD-10-CM | POA: Diagnosis not present

## 2019-09-15 DIAGNOSIS — Z79891 Long term (current) use of opiate analgesic: Secondary | ICD-10-CM | POA: Diagnosis not present

## 2019-09-15 DIAGNOSIS — I4891 Unspecified atrial fibrillation: Secondary | ICD-10-CM | POA: Diagnosis not present

## 2019-09-15 DIAGNOSIS — M170 Bilateral primary osteoarthritis of knee: Secondary | ICD-10-CM | POA: Diagnosis not present

## 2019-09-15 DIAGNOSIS — S8261XD Displaced fracture of lateral malleolus of right fibula, subsequent encounter for closed fracture with routine healing: Secondary | ICD-10-CM | POA: Diagnosis not present

## 2019-09-15 DIAGNOSIS — I10 Essential (primary) hypertension: Secondary | ICD-10-CM | POA: Diagnosis not present

## 2019-09-15 DIAGNOSIS — Z9849 Cataract extraction status, unspecified eye: Secondary | ICD-10-CM | POA: Diagnosis not present

## 2019-09-15 DIAGNOSIS — Z791 Long term (current) use of non-steroidal anti-inflammatories (NSAID): Secondary | ICD-10-CM | POA: Diagnosis not present

## 2019-09-15 DIAGNOSIS — M797 Fibromyalgia: Secondary | ICD-10-CM | POA: Diagnosis not present

## 2019-09-17 DIAGNOSIS — Z791 Long term (current) use of non-steroidal anti-inflammatories (NSAID): Secondary | ICD-10-CM | POA: Diagnosis not present

## 2019-09-17 DIAGNOSIS — D62 Acute posthemorrhagic anemia: Secondary | ICD-10-CM | POA: Diagnosis not present

## 2019-09-17 DIAGNOSIS — S42211D Unspecified displaced fracture of surgical neck of right humerus, subsequent encounter for fracture with routine healing: Secondary | ICD-10-CM | POA: Diagnosis not present

## 2019-09-17 DIAGNOSIS — M797 Fibromyalgia: Secondary | ICD-10-CM | POA: Diagnosis not present

## 2019-09-17 DIAGNOSIS — W010XXD Fall on same level from slipping, tripping and stumbling without subsequent striking against object, subsequent encounter: Secondary | ICD-10-CM | POA: Diagnosis not present

## 2019-09-17 DIAGNOSIS — Z9849 Cataract extraction status, unspecified eye: Secondary | ICD-10-CM | POA: Diagnosis not present

## 2019-09-17 DIAGNOSIS — I10 Essential (primary) hypertension: Secondary | ICD-10-CM | POA: Diagnosis not present

## 2019-09-17 DIAGNOSIS — Z79891 Long term (current) use of opiate analgesic: Secondary | ICD-10-CM | POA: Diagnosis not present

## 2019-09-17 DIAGNOSIS — Z85828 Personal history of other malignant neoplasm of skin: Secondary | ICD-10-CM | POA: Diagnosis not present

## 2019-09-17 DIAGNOSIS — M170 Bilateral primary osteoarthritis of knee: Secondary | ICD-10-CM | POA: Diagnosis not present

## 2019-09-17 DIAGNOSIS — I4891 Unspecified atrial fibrillation: Secondary | ICD-10-CM | POA: Diagnosis not present

## 2019-09-17 DIAGNOSIS — S8261XD Displaced fracture of lateral malleolus of right fibula, subsequent encounter for closed fracture with routine healing: Secondary | ICD-10-CM | POA: Diagnosis not present

## 2019-09-19 DIAGNOSIS — Z79891 Long term (current) use of opiate analgesic: Secondary | ICD-10-CM | POA: Diagnosis not present

## 2019-09-19 DIAGNOSIS — M170 Bilateral primary osteoarthritis of knee: Secondary | ICD-10-CM | POA: Diagnosis not present

## 2019-09-19 DIAGNOSIS — I4891 Unspecified atrial fibrillation: Secondary | ICD-10-CM | POA: Diagnosis not present

## 2019-09-19 DIAGNOSIS — D62 Acute posthemorrhagic anemia: Secondary | ICD-10-CM | POA: Diagnosis not present

## 2019-09-19 DIAGNOSIS — S8261XD Displaced fracture of lateral malleolus of right fibula, subsequent encounter for closed fracture with routine healing: Secondary | ICD-10-CM | POA: Diagnosis not present

## 2019-09-19 DIAGNOSIS — W010XXD Fall on same level from slipping, tripping and stumbling without subsequent striking against object, subsequent encounter: Secondary | ICD-10-CM | POA: Diagnosis not present

## 2019-09-19 DIAGNOSIS — Z85828 Personal history of other malignant neoplasm of skin: Secondary | ICD-10-CM | POA: Diagnosis not present

## 2019-09-19 DIAGNOSIS — M797 Fibromyalgia: Secondary | ICD-10-CM | POA: Diagnosis not present

## 2019-09-19 DIAGNOSIS — Z791 Long term (current) use of non-steroidal anti-inflammatories (NSAID): Secondary | ICD-10-CM | POA: Diagnosis not present

## 2019-09-19 DIAGNOSIS — Z9849 Cataract extraction status, unspecified eye: Secondary | ICD-10-CM | POA: Diagnosis not present

## 2019-09-19 DIAGNOSIS — I10 Essential (primary) hypertension: Secondary | ICD-10-CM | POA: Diagnosis not present

## 2019-09-19 DIAGNOSIS — S42211D Unspecified displaced fracture of surgical neck of right humerus, subsequent encounter for fracture with routine healing: Secondary | ICD-10-CM | POA: Diagnosis not present

## 2019-09-20 ENCOUNTER — Other Ambulatory Visit: Payer: Self-pay | Admitting: *Deleted

## 2019-09-20 NOTE — Patient Outreach (Signed)
[place] [facility]) Care Management  09/20/2019  Karmah Potocki 04/07/1945 [id number]   Weekly transition of care call placed to member.  State she is continuing to improve and adapt to performing tasks with just using on arm.  She is still active with PT and OT, son remains in the home helping to care for her and her parents.  She is no longer using a cane for stability, however advised to use whenever she is feeling unsteady to prevent further falls.  Still having some pain, report she was provided with Tylenol #3, which has help relieve the pain.  Denies any urgent concerns, agrees to follow up within the next week.  Goals Addressed              This Visit's Progress   .  To get back to how I was: Independent and caring for parents (pt-stated)   On track     [place] (see longitudinal plan of care for additional care plan information)  Current Barriers:  Marland Kitchen Knowledge Deficits related to fall precautions . Decreased adherence to prescribed treatment for fall prevention   Clinical Goal(s):  Marland Kitchen Over the next 28 days, patient will demonstrate improved adherence to prescribed treatment plan for decreasing falls as evidenced by patient reporting and review of EMR . Over the next 28days, patient will verbalize using fall risk reduction strategies discussed . Over the next 31 days, patient will not experience additional falls   Interventions:  . Provided written and verbal education re: Potential causes of falls and Fall prevention strategies . Reviewed medications and discussed potential side effects of medications such as dizziness and frequent urination . Assessed for s/s of orthostatic hypotension . Assessed for falls since last encounter. . Assessed patients knowledge of fall risk prevention secondary to previously provided education. . Assessed working status of life alert bracelet and patient adherence . Provided patient information for fall alert  systems . Provided education to patient re: fall prevention . Discussed plans with patient for ongoing care management follow up and provided patient with direct contact information for care management team . Reviewed scheduled/upcoming provider appointments including:   Patient Self Care Activities:  . Utilize sling and cane (assistive device) appropriately with all ambulation . De-clutter walkways . Change positions slowly . Wear secure fitting shoes at all times with ambulation . Utilize home lighting for dim lit areas . Have self and pet awareness at all times  Plan: . CCM RN CM will follow up in the next week          Valente David, Therapist, sports, MSN [place] Manager [phone]

## 2019-09-21 DIAGNOSIS — S42211D Unspecified displaced fracture of surgical neck of right humerus, subsequent encounter for fracture with routine healing: Secondary | ICD-10-CM | POA: Diagnosis not present

## 2019-09-21 DIAGNOSIS — Z9849 Cataract extraction status, unspecified eye: Secondary | ICD-10-CM | POA: Diagnosis not present

## 2019-09-21 DIAGNOSIS — D62 Acute posthemorrhagic anemia: Secondary | ICD-10-CM | POA: Diagnosis not present

## 2019-09-21 DIAGNOSIS — M170 Bilateral primary osteoarthritis of knee: Secondary | ICD-10-CM | POA: Diagnosis not present

## 2019-09-21 DIAGNOSIS — I4891 Unspecified atrial fibrillation: Secondary | ICD-10-CM | POA: Diagnosis not present

## 2019-09-21 DIAGNOSIS — Z85828 Personal history of other malignant neoplasm of skin: Secondary | ICD-10-CM | POA: Diagnosis not present

## 2019-09-21 DIAGNOSIS — S8261XD Displaced fracture of lateral malleolus of right fibula, subsequent encounter for closed fracture with routine healing: Secondary | ICD-10-CM | POA: Diagnosis not present

## 2019-09-21 DIAGNOSIS — Z79891 Long term (current) use of opiate analgesic: Secondary | ICD-10-CM | POA: Diagnosis not present

## 2019-09-21 DIAGNOSIS — W010XXD Fall on same level from slipping, tripping and stumbling without subsequent striking against object, subsequent encounter: Secondary | ICD-10-CM | POA: Diagnosis not present

## 2019-09-21 DIAGNOSIS — M797 Fibromyalgia: Secondary | ICD-10-CM | POA: Diagnosis not present

## 2019-09-21 DIAGNOSIS — Z791 Long term (current) use of non-steroidal anti-inflammatories (NSAID): Secondary | ICD-10-CM | POA: Diagnosis not present

## 2019-09-21 DIAGNOSIS — I10 Essential (primary) hypertension: Secondary | ICD-10-CM | POA: Diagnosis not present

## 2019-09-23 DIAGNOSIS — S42211D Unspecified displaced fracture of surgical neck of right humerus, subsequent encounter for fracture with routine healing: Secondary | ICD-10-CM | POA: Diagnosis not present

## 2019-09-23 DIAGNOSIS — M797 Fibromyalgia: Secondary | ICD-10-CM | POA: Diagnosis not present

## 2019-09-23 DIAGNOSIS — I10 Essential (primary) hypertension: Secondary | ICD-10-CM | POA: Diagnosis not present

## 2019-09-23 DIAGNOSIS — I4891 Unspecified atrial fibrillation: Secondary | ICD-10-CM | POA: Diagnosis not present

## 2019-09-23 DIAGNOSIS — M170 Bilateral primary osteoarthritis of knee: Secondary | ICD-10-CM | POA: Diagnosis not present

## 2019-09-23 DIAGNOSIS — W010XXD Fall on same level from slipping, tripping and stumbling without subsequent striking against object, subsequent encounter: Secondary | ICD-10-CM | POA: Diagnosis not present

## 2019-09-23 DIAGNOSIS — Z85828 Personal history of other malignant neoplasm of skin: Secondary | ICD-10-CM | POA: Diagnosis not present

## 2019-09-23 DIAGNOSIS — Z9849 Cataract extraction status, unspecified eye: Secondary | ICD-10-CM | POA: Diagnosis not present

## 2019-09-23 DIAGNOSIS — D62 Acute posthemorrhagic anemia: Secondary | ICD-10-CM | POA: Diagnosis not present

## 2019-09-23 DIAGNOSIS — S8261XD Displaced fracture of lateral malleolus of right fibula, subsequent encounter for closed fracture with routine healing: Secondary | ICD-10-CM | POA: Diagnosis not present

## 2019-09-23 DIAGNOSIS — Z79891 Long term (current) use of opiate analgesic: Secondary | ICD-10-CM | POA: Diagnosis not present

## 2019-09-23 DIAGNOSIS — Z791 Long term (current) use of non-steroidal anti-inflammatories (NSAID): Secondary | ICD-10-CM | POA: Diagnosis not present

## 2019-09-27 ENCOUNTER — Other Ambulatory Visit: Payer: Self-pay | Admitting: *Deleted

## 2019-09-27 DIAGNOSIS — Z79891 Long term (current) use of opiate analgesic: Secondary | ICD-10-CM | POA: Diagnosis not present

## 2019-09-27 DIAGNOSIS — Z85828 Personal history of other malignant neoplasm of skin: Secondary | ICD-10-CM | POA: Diagnosis not present

## 2019-09-27 DIAGNOSIS — Z791 Long term (current) use of non-steroidal anti-inflammatories (NSAID): Secondary | ICD-10-CM | POA: Diagnosis not present

## 2019-09-27 DIAGNOSIS — D62 Acute posthemorrhagic anemia: Secondary | ICD-10-CM | POA: Diagnosis not present

## 2019-09-27 DIAGNOSIS — I4891 Unspecified atrial fibrillation: Secondary | ICD-10-CM | POA: Diagnosis not present

## 2019-09-27 DIAGNOSIS — S42211D Unspecified displaced fracture of surgical neck of right humerus, subsequent encounter for fracture with routine healing: Secondary | ICD-10-CM | POA: Diagnosis not present

## 2019-09-27 DIAGNOSIS — M170 Bilateral primary osteoarthritis of knee: Secondary | ICD-10-CM | POA: Diagnosis not present

## 2019-09-27 DIAGNOSIS — Z9849 Cataract extraction status, unspecified eye: Secondary | ICD-10-CM | POA: Diagnosis not present

## 2019-09-27 DIAGNOSIS — W010XXD Fall on same level from slipping, tripping and stumbling without subsequent striking against object, subsequent encounter: Secondary | ICD-10-CM | POA: Diagnosis not present

## 2019-09-27 DIAGNOSIS — M797 Fibromyalgia: Secondary | ICD-10-CM | POA: Diagnosis not present

## 2019-09-27 DIAGNOSIS — I10 Essential (primary) hypertension: Secondary | ICD-10-CM | POA: Diagnosis not present

## 2019-09-27 DIAGNOSIS — S8261XD Displaced fracture of lateral malleolus of right fibula, subsequent encounter for closed fracture with routine healing: Secondary | ICD-10-CM | POA: Diagnosis not present

## 2019-09-27 NOTE — Patient Outreach (Signed)
[facility]) Care Management  09/27/2019  Mary Weeks [birth year] [id number]   Weekly transition of care call placed to member.  Report she continues to heal, still having intermittent pain, relieved by Tylenol #3.  She has appointment with ortho specialist next week, looking forward to feedback on recovery.  Denies any urgent issues, continue to have the support of her son in the home.  Agrees to follow up within the next week.  Goals Addressed              This Visit's Progress   .  To get back to how I was: Independent and caring for parents (pt-stated)   On track     [place] (see longitudinal plan of care for additional care plan information)  Current Barriers:  Marland Kitchen Knowledge Deficits related to fall precautions . Decreased adherence to prescribed treatment for fall prevention   Clinical Goal(s):  Marland Kitchen Over the next 28 days, patient will demonstrate improved adherence to prescribed treatment plan for decreasing falls as evidenced by patient reporting and review of EMR . Over the next 28days, patient will verbalize using fall risk reduction strategies discussed . Over the next 31 days, patient will not experience additional falls   Interventions:  . Provided written and verbal education re: Potential causes of falls and Fall prevention strategies . Reviewed medications and discussed potential side effects of medications such as dizziness and frequent urination . Assessed for s/s of orthostatic hypotension . Assessed for falls since last encounter. . Assessed patients knowledge of fall risk prevention secondary to previously provided education. . Assessed working status of life alert bracelet and patient adherence . Provided patient information for fall alert systems . Provided education to patient re: fall prevention . Discussed plans with patient for ongoing care management follow up and provided patient with direct contact information for care  management team . Reviewed scheduled/upcoming provider appointments including:   Patient Self Care Activities:  . Utilize sling and cane (assistive device) appropriately with all ambulation . De-clutter walkways . Change positions slowly . Wear secure fitting shoes at all times with ambulation . Utilize home lighting for dim lit areas . Have self and pet awareness at all times  Plan: . CCM RN CM will follow up in the next week          Mary Weeks, Mary Weeks, Mary Weeks, Mary Weeks Manager [phone]

## 2019-09-29 DIAGNOSIS — D62 Acute posthemorrhagic anemia: Secondary | ICD-10-CM | POA: Diagnosis not present

## 2019-09-29 DIAGNOSIS — M797 Fibromyalgia: Secondary | ICD-10-CM | POA: Diagnosis not present

## 2019-09-29 DIAGNOSIS — Z85828 Personal history of other malignant neoplasm of skin: Secondary | ICD-10-CM | POA: Diagnosis not present

## 2019-09-29 DIAGNOSIS — Z791 Long term (current) use of non-steroidal anti-inflammatories (NSAID): Secondary | ICD-10-CM | POA: Diagnosis not present

## 2019-09-29 DIAGNOSIS — Z9849 Cataract extraction status, unspecified eye: Secondary | ICD-10-CM | POA: Diagnosis not present

## 2019-09-29 DIAGNOSIS — Z79891 Long term (current) use of opiate analgesic: Secondary | ICD-10-CM | POA: Diagnosis not present

## 2019-09-29 DIAGNOSIS — S8261XD Displaced fracture of lateral malleolus of right fibula, subsequent encounter for closed fracture with routine healing: Secondary | ICD-10-CM | POA: Diagnosis not present

## 2019-09-29 DIAGNOSIS — W010XXD Fall on same level from slipping, tripping and stumbling without subsequent striking against object, subsequent encounter: Secondary | ICD-10-CM | POA: Diagnosis not present

## 2019-09-29 DIAGNOSIS — S42211D Unspecified displaced fracture of surgical neck of right humerus, subsequent encounter for fracture with routine healing: Secondary | ICD-10-CM | POA: Diagnosis not present

## 2019-09-29 DIAGNOSIS — I4891 Unspecified atrial fibrillation: Secondary | ICD-10-CM | POA: Diagnosis not present

## 2019-09-29 DIAGNOSIS — I10 Essential (primary) hypertension: Secondary | ICD-10-CM | POA: Diagnosis not present

## 2019-09-29 DIAGNOSIS — M170 Bilateral primary osteoarthritis of knee: Secondary | ICD-10-CM | POA: Diagnosis not present

## 2019-10-01 DIAGNOSIS — R197 Diarrhea, unspecified: Secondary | ICD-10-CM | POA: Diagnosis not present

## 2019-10-01 DIAGNOSIS — Z96651 Presence of right artificial knee joint: Secondary | ICD-10-CM | POA: Diagnosis not present

## 2019-10-01 DIAGNOSIS — D72829 Elevated white blood cell count, unspecified: Secondary | ICD-10-CM | POA: Diagnosis not present

## 2019-10-01 DIAGNOSIS — I10 Essential (primary) hypertension: Secondary | ICD-10-CM | POA: Diagnosis not present

## 2019-10-01 DIAGNOSIS — R531 Weakness: Secondary | ICD-10-CM | POA: Diagnosis not present

## 2019-10-01 DIAGNOSIS — K760 Fatty (change of) liver, not elsewhere classified: Secondary | ICD-10-CM | POA: Diagnosis not present

## 2019-10-01 DIAGNOSIS — R4020 Unspecified coma: Secondary | ICD-10-CM | POA: Diagnosis not present

## 2019-10-01 DIAGNOSIS — Z87891 Personal history of nicotine dependence: Secondary | ICD-10-CM | POA: Diagnosis not present

## 2019-10-01 DIAGNOSIS — R55 Syncope and collapse: Secondary | ICD-10-CM | POA: Insufficient documentation

## 2019-10-01 DIAGNOSIS — S199XXA Unspecified injury of neck, initial encounter: Secondary | ICD-10-CM | POA: Diagnosis not present

## 2019-10-01 DIAGNOSIS — E876 Hypokalemia: Secondary | ICD-10-CM | POA: Diagnosis not present

## 2019-10-01 DIAGNOSIS — M47812 Spondylosis without myelopathy or radiculopathy, cervical region: Secondary | ICD-10-CM | POA: Diagnosis not present

## 2019-10-01 DIAGNOSIS — I6529 Occlusion and stenosis of unspecified carotid artery: Secondary | ICD-10-CM | POA: Diagnosis not present

## 2019-10-01 DIAGNOSIS — Z9049 Acquired absence of other specified parts of digestive tract: Secondary | ICD-10-CM | POA: Diagnosis not present

## 2019-10-01 DIAGNOSIS — R404 Transient alteration of awareness: Secondary | ICD-10-CM | POA: Diagnosis not present

## 2019-10-01 DIAGNOSIS — G894 Chronic pain syndrome: Secondary | ICD-10-CM | POA: Diagnosis not present

## 2019-10-01 DIAGNOSIS — S0083XA Contusion of other part of head, initial encounter: Secondary | ICD-10-CM | POA: Diagnosis not present

## 2019-10-01 DIAGNOSIS — S0990XA Unspecified injury of head, initial encounter: Secondary | ICD-10-CM | POA: Diagnosis not present

## 2019-10-01 DIAGNOSIS — Z791 Long term (current) use of non-steroidal anti-inflammatories (NSAID): Secondary | ICD-10-CM | POA: Diagnosis not present

## 2019-10-01 DIAGNOSIS — I700 Atherosclerosis of aorta: Secondary | ICD-10-CM | POA: Diagnosis not present

## 2019-10-01 DIAGNOSIS — R42 Dizziness and giddiness: Secondary | ICD-10-CM | POA: Diagnosis not present

## 2019-10-01 DIAGNOSIS — I4891 Unspecified atrial fibrillation: Secondary | ICD-10-CM | POA: Diagnosis not present

## 2019-10-01 DIAGNOSIS — E870 Hyperosmolality and hypernatremia: Secondary | ICD-10-CM | POA: Diagnosis not present

## 2019-10-01 DIAGNOSIS — S40921A Unspecified superficial injury of right upper arm, initial encounter: Secondary | ICD-10-CM | POA: Diagnosis not present

## 2019-10-01 DIAGNOSIS — I480 Paroxysmal atrial fibrillation: Secondary | ICD-10-CM | POA: Insufficient documentation

## 2019-10-01 DIAGNOSIS — E860 Dehydration: Secondary | ICD-10-CM | POA: Diagnosis not present

## 2019-10-01 DIAGNOSIS — Z7952 Long term (current) use of systemic steroids: Secondary | ICD-10-CM | POA: Diagnosis not present

## 2019-10-01 DIAGNOSIS — Z743 Need for continuous supervision: Secondary | ICD-10-CM | POA: Diagnosis not present

## 2019-10-01 DIAGNOSIS — M797 Fibromyalgia: Secondary | ICD-10-CM | POA: Diagnosis not present

## 2019-10-01 DIAGNOSIS — S0003XA Contusion of scalp, initial encounter: Secondary | ICD-10-CM | POA: Diagnosis not present

## 2019-10-01 DIAGNOSIS — R110 Nausea: Secondary | ICD-10-CM | POA: Diagnosis not present

## 2019-10-01 DIAGNOSIS — I739 Peripheral vascular disease, unspecified: Secondary | ICD-10-CM | POA: Diagnosis not present

## 2019-10-01 DIAGNOSIS — R6889 Other general symptoms and signs: Secondary | ICD-10-CM | POA: Diagnosis not present

## 2019-10-01 DIAGNOSIS — I6523 Occlusion and stenosis of bilateral carotid arteries: Secondary | ICD-10-CM | POA: Diagnosis not present

## 2019-10-02 DIAGNOSIS — R55 Syncope and collapse: Secondary | ICD-10-CM | POA: Diagnosis not present

## 2019-10-02 DIAGNOSIS — Z992 Dependence on renal dialysis: Secondary | ICD-10-CM | POA: Diagnosis not present

## 2019-10-02 DIAGNOSIS — E860 Dehydration: Secondary | ICD-10-CM | POA: Diagnosis not present

## 2019-10-03 DIAGNOSIS — E860 Dehydration: Secondary | ICD-10-CM | POA: Diagnosis not present

## 2019-10-03 DIAGNOSIS — E876 Hypokalemia: Secondary | ICD-10-CM | POA: Diagnosis not present

## 2019-10-03 DIAGNOSIS — R55 Syncope and collapse: Secondary | ICD-10-CM | POA: Diagnosis not present

## 2019-10-04 ENCOUNTER — Other Ambulatory Visit: Payer: Self-pay | Admitting: *Deleted

## 2019-10-04 DIAGNOSIS — K219 Gastro-esophageal reflux disease without esophagitis: Secondary | ICD-10-CM | POA: Diagnosis not present

## 2019-10-04 DIAGNOSIS — S42431A Displaced fracture (avulsion) of lateral epicondyle of right humerus, initial encounter for closed fracture: Secondary | ICD-10-CM | POA: Diagnosis not present

## 2019-10-04 DIAGNOSIS — E7849 Other hyperlipidemia: Secondary | ICD-10-CM | POA: Diagnosis not present

## 2019-10-04 NOTE — Patient Outreach (Signed)
[facility] [facility]) Care Management  10/04/2019  Mary Weeks 11/22/1945 [id number]   Weekly transition of care call placed to member, state she is on her way out to an MD appointment, request this care manager call back later this afternoon.  Noted that member was seen at [facility] for dehydration, discharged home on 9/13.  Will attempt call later this afternoon, if unable to do so, will follow up within the next 3-4 business days.   Update @ 1515:  Call placed back to member per her request, no answer, unable to leave voice message.  Will follow up within the next 3-4 business days.   Valente David, [place], MSN [place] [phone]

## 2019-10-05 DIAGNOSIS — D62 Acute posthemorrhagic anemia: Secondary | ICD-10-CM | POA: Diagnosis not present

## 2019-10-05 DIAGNOSIS — Z79891 Long term (current) use of opiate analgesic: Secondary | ICD-10-CM | POA: Diagnosis not present

## 2019-10-05 DIAGNOSIS — M797 Fibromyalgia: Secondary | ICD-10-CM | POA: Diagnosis not present

## 2019-10-05 DIAGNOSIS — S8261XD Displaced fracture of lateral malleolus of right fibula, subsequent encounter for closed fracture with routine healing: Secondary | ICD-10-CM | POA: Diagnosis not present

## 2019-10-05 DIAGNOSIS — Z9849 Cataract extraction status, unspecified eye: Secondary | ICD-10-CM | POA: Diagnosis not present

## 2019-10-05 DIAGNOSIS — Z791 Long term (current) use of non-steroidal anti-inflammatories (NSAID): Secondary | ICD-10-CM | POA: Diagnosis not present

## 2019-10-05 DIAGNOSIS — S42211D Unspecified displaced fracture of surgical neck of right humerus, subsequent encounter for fracture with routine healing: Secondary | ICD-10-CM | POA: Diagnosis not present

## 2019-10-05 DIAGNOSIS — Z85828 Personal history of other malignant neoplasm of skin: Secondary | ICD-10-CM | POA: Diagnosis not present

## 2019-10-05 DIAGNOSIS — I4891 Unspecified atrial fibrillation: Secondary | ICD-10-CM | POA: Diagnosis not present

## 2019-10-05 DIAGNOSIS — W010XXD Fall on same level from slipping, tripping and stumbling without subsequent striking against object, subsequent encounter: Secondary | ICD-10-CM | POA: Diagnosis not present

## 2019-10-05 DIAGNOSIS — M170 Bilateral primary osteoarthritis of knee: Secondary | ICD-10-CM | POA: Diagnosis not present

## 2019-10-05 DIAGNOSIS — I10 Essential (primary) hypertension: Secondary | ICD-10-CM | POA: Diagnosis not present

## 2019-10-10 ENCOUNTER — Other Ambulatory Visit: Payer: Self-pay | Admitting: *Deleted

## 2019-10-10 NOTE — Patient Outreach (Signed)
[place] [facility]) Care Management  10/10/2019  Mary Weeks Feb 20, 1945 [id number]   Outreach attempt #2, unsuccessful.  Weekly transition of care call placed to member, no answer.  Unable to leave voice message.  Will send unsuccessful outreach letter and follow up within the next 3-4 business days.  Valente David, [place], MSN [place] [phone]

## 2019-10-11 DIAGNOSIS — Z85828 Personal history of other malignant neoplasm of skin: Secondary | ICD-10-CM | POA: Diagnosis not present

## 2019-10-11 DIAGNOSIS — Z791 Long term (current) use of non-steroidal anti-inflammatories (NSAID): Secondary | ICD-10-CM | POA: Diagnosis not present

## 2019-10-11 DIAGNOSIS — I10 Essential (primary) hypertension: Secondary | ICD-10-CM | POA: Diagnosis not present

## 2019-10-11 DIAGNOSIS — Z9849 Cataract extraction status, unspecified eye: Secondary | ICD-10-CM | POA: Diagnosis not present

## 2019-10-11 DIAGNOSIS — S42211D Unspecified displaced fracture of surgical neck of right humerus, subsequent encounter for fracture with routine healing: Secondary | ICD-10-CM | POA: Diagnosis not present

## 2019-10-11 DIAGNOSIS — M797 Fibromyalgia: Secondary | ICD-10-CM | POA: Diagnosis not present

## 2019-10-11 DIAGNOSIS — S8261XD Displaced fracture of lateral malleolus of right fibula, subsequent encounter for closed fracture with routine healing: Secondary | ICD-10-CM | POA: Diagnosis not present

## 2019-10-11 DIAGNOSIS — D62 Acute posthemorrhagic anemia: Secondary | ICD-10-CM | POA: Diagnosis not present

## 2019-10-11 DIAGNOSIS — I4891 Unspecified atrial fibrillation: Secondary | ICD-10-CM | POA: Diagnosis not present

## 2019-10-11 DIAGNOSIS — Z79891 Long term (current) use of opiate analgesic: Secondary | ICD-10-CM | POA: Diagnosis not present

## 2019-10-11 DIAGNOSIS — W010XXD Fall on same level from slipping, tripping and stumbling without subsequent striking against object, subsequent encounter: Secondary | ICD-10-CM | POA: Diagnosis not present

## 2019-10-11 DIAGNOSIS — M170 Bilateral primary osteoarthritis of knee: Secondary | ICD-10-CM | POA: Diagnosis not present

## 2019-10-13 ENCOUNTER — Other Ambulatory Visit: Payer: Self-pay | Admitting: *Deleted

## 2019-10-13 DIAGNOSIS — Z79891 Long term (current) use of opiate analgesic: Secondary | ICD-10-CM | POA: Diagnosis not present

## 2019-10-13 DIAGNOSIS — M797 Fibromyalgia: Secondary | ICD-10-CM | POA: Diagnosis not present

## 2019-10-13 DIAGNOSIS — Z791 Long term (current) use of non-steroidal anti-inflammatories (NSAID): Secondary | ICD-10-CM | POA: Diagnosis not present

## 2019-10-13 DIAGNOSIS — S42211D Unspecified displaced fracture of surgical neck of right humerus, subsequent encounter for fracture with routine healing: Secondary | ICD-10-CM | POA: Diagnosis not present

## 2019-10-13 DIAGNOSIS — S8261XD Displaced fracture of lateral malleolus of right fibula, subsequent encounter for closed fracture with routine healing: Secondary | ICD-10-CM | POA: Diagnosis not present

## 2019-10-13 DIAGNOSIS — Z9849 Cataract extraction status, unspecified eye: Secondary | ICD-10-CM | POA: Diagnosis not present

## 2019-10-13 DIAGNOSIS — W010XXD Fall on same level from slipping, tripping and stumbling without subsequent striking against object, subsequent encounter: Secondary | ICD-10-CM | POA: Diagnosis not present

## 2019-10-13 DIAGNOSIS — D62 Acute posthemorrhagic anemia: Secondary | ICD-10-CM | POA: Diagnosis not present

## 2019-10-13 DIAGNOSIS — Z85828 Personal history of other malignant neoplasm of skin: Secondary | ICD-10-CM | POA: Diagnosis not present

## 2019-10-13 DIAGNOSIS — I4891 Unspecified atrial fibrillation: Secondary | ICD-10-CM | POA: Diagnosis not present

## 2019-10-13 DIAGNOSIS — M170 Bilateral primary osteoarthritis of knee: Secondary | ICD-10-CM | POA: Diagnosis not present

## 2019-10-13 DIAGNOSIS — I10 Essential (primary) hypertension: Secondary | ICD-10-CM | POA: Diagnosis not present

## 2019-10-13 NOTE — Patient Outreach (Signed)
[facility]) Care Management  10/13/2019  Rhenda [place] 14-Aug-1945 [id number]   Outreach attempt #3, unsuccessful.  Weekly transition of care call placed to member, no answer.  Unable to leave voice message.  Will make 4th and final attempt within the next 3 weeks.  If remain unsuccessful, will close case due to inability to maintain contact.  Valente David, [place], MSN [place] [phone]

## 2019-10-18 DIAGNOSIS — W010XXD Fall on same level from slipping, tripping and stumbling without subsequent striking against object, subsequent encounter: Secondary | ICD-10-CM | POA: Diagnosis not present

## 2019-10-18 DIAGNOSIS — M170 Bilateral primary osteoarthritis of knee: Secondary | ICD-10-CM | POA: Diagnosis not present

## 2019-10-18 DIAGNOSIS — I10 Essential (primary) hypertension: Secondary | ICD-10-CM | POA: Diagnosis not present

## 2019-10-18 DIAGNOSIS — D62 Acute posthemorrhagic anemia: Secondary | ICD-10-CM | POA: Diagnosis not present

## 2019-10-18 DIAGNOSIS — I4891 Unspecified atrial fibrillation: Secondary | ICD-10-CM | POA: Diagnosis not present

## 2019-10-18 DIAGNOSIS — Z791 Long term (current) use of non-steroidal anti-inflammatories (NSAID): Secondary | ICD-10-CM | POA: Diagnosis not present

## 2019-10-18 DIAGNOSIS — Z9849 Cataract extraction status, unspecified eye: Secondary | ICD-10-CM | POA: Diagnosis not present

## 2019-10-18 DIAGNOSIS — S8261XD Displaced fracture of lateral malleolus of right fibula, subsequent encounter for closed fracture with routine healing: Secondary | ICD-10-CM | POA: Diagnosis not present

## 2019-10-18 DIAGNOSIS — Z85828 Personal history of other malignant neoplasm of skin: Secondary | ICD-10-CM | POA: Diagnosis not present

## 2019-10-18 DIAGNOSIS — S42211D Unspecified displaced fracture of surgical neck of right humerus, subsequent encounter for fracture with routine healing: Secondary | ICD-10-CM | POA: Diagnosis not present

## 2019-10-18 DIAGNOSIS — M797 Fibromyalgia: Secondary | ICD-10-CM | POA: Diagnosis not present

## 2019-10-18 DIAGNOSIS — Z79891 Long term (current) use of opiate analgesic: Secondary | ICD-10-CM | POA: Diagnosis not present

## 2019-10-19 DIAGNOSIS — S42201A Unspecified fracture of upper end of right humerus, initial encounter for closed fracture: Secondary | ICD-10-CM | POA: Insufficient documentation

## 2019-10-19 DIAGNOSIS — S42211A Unspecified displaced fracture of surgical neck of right humerus, initial encounter for closed fracture: Secondary | ICD-10-CM | POA: Diagnosis not present

## 2019-10-19 DIAGNOSIS — Z96653 Presence of artificial knee joint, bilateral: Secondary | ICD-10-CM | POA: Diagnosis not present

## 2019-10-19 DIAGNOSIS — S42294A Other nondisplaced fracture of upper end of right humerus, initial encounter for closed fracture: Secondary | ICD-10-CM | POA: Diagnosis not present

## 2019-10-20 DIAGNOSIS — M170 Bilateral primary osteoarthritis of knee: Secondary | ICD-10-CM | POA: Diagnosis not present

## 2019-10-20 DIAGNOSIS — S42211D Unspecified displaced fracture of surgical neck of right humerus, subsequent encounter for fracture with routine healing: Secondary | ICD-10-CM | POA: Diagnosis not present

## 2019-10-20 DIAGNOSIS — W010XXD Fall on same level from slipping, tripping and stumbling without subsequent striking against object, subsequent encounter: Secondary | ICD-10-CM | POA: Diagnosis not present

## 2019-10-20 DIAGNOSIS — M797 Fibromyalgia: Secondary | ICD-10-CM | POA: Diagnosis not present

## 2019-10-20 DIAGNOSIS — D62 Acute posthemorrhagic anemia: Secondary | ICD-10-CM | POA: Diagnosis not present

## 2019-10-20 DIAGNOSIS — Z791 Long term (current) use of non-steroidal anti-inflammatories (NSAID): Secondary | ICD-10-CM | POA: Diagnosis not present

## 2019-10-20 DIAGNOSIS — S8261XD Displaced fracture of lateral malleolus of right fibula, subsequent encounter for closed fracture with routine healing: Secondary | ICD-10-CM | POA: Diagnosis not present

## 2019-10-20 DIAGNOSIS — Z9849 Cataract extraction status, unspecified eye: Secondary | ICD-10-CM | POA: Diagnosis not present

## 2019-10-20 DIAGNOSIS — I10 Essential (primary) hypertension: Secondary | ICD-10-CM | POA: Diagnosis not present

## 2019-10-20 DIAGNOSIS — Z79891 Long term (current) use of opiate analgesic: Secondary | ICD-10-CM | POA: Diagnosis not present

## 2019-10-20 DIAGNOSIS — I4891 Unspecified atrial fibrillation: Secondary | ICD-10-CM | POA: Diagnosis not present

## 2019-10-20 DIAGNOSIS — Z85828 Personal history of other malignant neoplasm of skin: Secondary | ICD-10-CM | POA: Diagnosis not present

## 2019-11-03 ENCOUNTER — Other Ambulatory Visit: Payer: Self-pay | Admitting: *Deleted

## 2019-11-03 NOTE — Patient Outreach (Signed)
[facility]) Care Management  11/03/2019  Shruti Arrey Mar 10, 1945 [id number]   Outreach attempt #4, successful.  Call placed to member to follow up on recovery from fall.  State she is no longer working with PT due to her fracture not healing as well as provider had hoped.  She will follow up in a couple weeks, if healed more she will restart therapy.  She complains today about diarrhea, started yesterday.  This has been recurrent, report the last time was last month, became dehydrated and passed out.  Report she is still able to eat and hydrate, denies any pain.  She will take her mother to PCP visit today (same as her own) and she will discuss with him at that time.  She has seen GI specialist in the past, will consider follow up.  Denies any urgent concerns, encouraged to contact this care manager with questions.  Agrees to follow up within the next month.  Goals Addressed              This Visit's Progress   .  [facility] - Make and Keep All Appointments        Follow Up Date 11/14    - ask family or friend for a ride - call to cancel if needed - keep a calendar with appointment dates    Why is this important?   Part of staying healthy is seeing the doctor for follow-up care.  If you forget your appointments, there are some things you can do to stay on track.    Notes:     .  THN - Matintain My Quality of Life        Follow Up Date 12/14   - discuss my treatment options with the doctor or nurse - do one enjoyable thing every day - learn something new by asking, reading and searching the Internet every day    Why is this important?   Having a long-term illness can be scary.  It can also be stressful for you and your caregiver.  These steps may help.    Notes:     .  THN - no recurrent falls        Discussed fall prevention    .  COMPLETED: To get back to how I was: Independent and caring for parents (pt-stated)        CARE PLAN ENTRY (see longitudinal  plan of care for additional care plan information)  Current Barriers:  Marland Kitchen Knowledge Deficits related to fall precautions . Decreased adherence to prescribed treatment for fall prevention   Clinical Goal(s):  Marland Kitchen Over the next 28 days, patient will demonstrate improved adherence to prescribed treatment plan for decreasing falls as evidenced by patient reporting and review of EMR . Over the next 28days, patient will verbalize using fall risk reduction strategies discussed . Over the next 31 days, patient will not experience additional falls   Interventions:  . Provided written and verbal education re: Potential causes of falls and Fall prevention strategies . Reviewed medications and discussed potential side effects of medications such as dizziness and frequent urination . Assessed for s/s of orthostatic hypotension . Assessed for falls since last encounter. . Assessed patients knowledge of fall risk prevention secondary to previously provided education. . Assessed working status of life alert bracelet and patient adherence . Provided patient information for fall alert systems . Provided education to patient re: fall prevention . Discussed plans with patient for ongoing care management follow  up and provided patient with direct contact information for care management team . Reviewed scheduled/upcoming provider appointments including:   Patient Self Care Activities:  . Utilize sling and cane (assistive device) appropriately with all ambulation . De-clutter walkways . Change positions slowly . Wear secure fitting shoes at all times with ambulation . Utilize home lighting for dim lit areas . Have self and pet awareness at all times  Plan: . CCM RN CM will follow up in the next week   Resolving due to duplicate goal       Valente David, Therapist, sports, MSN [place] [phone]

## 2019-12-01 ENCOUNTER — Other Ambulatory Visit: Payer: Self-pay | Admitting: *Deleted

## 2019-12-01 NOTE — Patient Outreach (Signed)
[facility]) Care Management  12/01/2019  Kurstin Dimarzo [birth year] [id number]   Call placed to member, no answer. Phone rang busy with 2 attempts, unable to leave message.  Will follow up within the next 3-4 business days.  Valente David, [place], MSN [place] [phone]

## 2019-12-06 ENCOUNTER — Other Ambulatory Visit: Payer: Self-pay | Admitting: *Deleted

## 2019-12-06 NOTE — Patient Outreach (Signed)
[facility]) Care Management  12/06/2019  Mary Weeks Jan 09, 1946 [id number]   Outreach attempt #2, unsuccessful, HIPAA compliant voice message left.  Will send unsuccessful outreach letter and follow up within the next 3-4 business days.  Valente David, [place], MSN [place] [phone]

## 2019-12-12 ENCOUNTER — Other Ambulatory Visit: Payer: Self-pay | Admitting: *Deleted

## 2019-12-12 NOTE — Patient Outreach (Signed)
[place] [facility]) Care Management  12/12/2019  Mary Weeks 06/09/1945 [id number]   Outreach attempt #2, successful.  Member report having no current falls, still has concern with shoulder due to fall this past summer.  She has had MRI and will follow up with ortho specialist today to discuss results.  She is hoping she will not need surgery and will be able to continue with PT.  Denies any other concerns, denies any ongoing unmanageable pain.  Encouraged to contact this care manager with questions, agrees to follow up within the next month.  Goals Addressed            This Visit's Progress   . [facility] - Make and Keep All Appointments   On track    Follow Up Date 12/22   - ask family or friend for a ride - call to cancel if needed - keep a calendar with appointment dates    Why is this important?   Part of staying healthy is seeing the doctor for follow-up care.  If you forget your appointments, there are some things you can do to stay on track.    Notes:   11/22 - Upcoming appointments reviewed with member    . THN - Matintain My Quality of Life   On track    Follow Up Date 12/14   - discuss my treatment options with the doctor or nurse - do one enjoyable thing every day - learn something new by asking, reading and searching the Internet every day    Why is this important?   Having a long-term illness can be scary.  It can also be stressful for you and your caregiver.  These steps may help.    Notes:     . THN - no recurrent falls   On track    Discussed fall prevention  11/22 - re-educated on use of assist devices to decrease fall risk      Valente David, Therapist, sports, MSN [place] [phone]

## 2020-01-09 ENCOUNTER — Other Ambulatory Visit: Payer: Self-pay | Admitting: *Deleted

## 2020-01-09 NOTE — Patient Outreach (Signed)
[place] [facility]) Care Management  [place]  01/09/2020   Chantilly Linskey 1945-06-27 [id number]  Outgoing call placed to member to follow up on recovery from shoulder fracture.  Was seen by ortho specialist last month and told that it is still not healing, will require surgery.  She has since started PT about 2 days a week, report increase in movement and strength.  State pain is "not that bad."  Taking Tylenol and Ibuprofen for pain relief.  Will follow up with ortho on 1/14 to discuss plan for surgery.  Report chronic conditions (ACS and A-fib) managed, denies any chest pain, palpitations.  Blood pressure and HR stable, report today's reading of 120/84 and 75.  Denies any urgent concerns, encouraged to contact this care manager with questions.    Encounter Medications:  Outpatient Encounter Medications as of 01/09/2020  Medication Sig Note  . aspirin 81 MG chewable tablet Chew 81 mg by mouth daily.   . busPIRone (BUSPAR) 15 MG tablet Take 15 mg by mouth 2 (two) times daily.   . carvedilol (COREG) 3.125 MG tablet Take 1 tablet (3.125 mg total) by mouth 2 (two) times daily with a meal.   . Cholecalciferol (VITAMIN D3) 2000 UNITS capsule Take 2,000 Units by mouth daily.   . colestipol (COLESTID) 1 g tablet Take 1 g by mouth 2 (two) times daily.   Marland Kitchen diltiazem (CARDIZEM) 30 MG tablet Take 1 tablet (30 mg total) by mouth every 12 (twelve) hours.   Marland Kitchen FLUoxetine (PROZAC) 20 MG capsule Take 20 mg by mouth daily.   Marland Kitchen gabapentin (NEURONTIN) 300 MG capsule Take 1 capsule (300 mg total) by mouth at bedtime. 01/09/2020: Taking 3 times a day  . potassium chloride SA (K-DUR,KLOR-CON) 20 MEQ tablet Take 1 tablet (20 mEq total) by mouth daily.   . rosuvastatin (CRESTOR) 10 MG tablet Take 10 mg by mouth daily.   Marland Kitchen tiZANidine (ZANAFLEX) 4 MG tablet Take 4 mg by mouth 2 (two) times daily.   . traMADol (ULTRAM) 50 MG tablet Take 50 mg by mouth in the morning, at noon, and at bedtime.   .  rivaroxaban (XARELTO) 20 MG TABS tablet Take 1 tablet (20 mg total) by mouth daily. (Patient not taking: Reported on 01/09/2020)    No facility-administered encounter medications on file as of 01/09/2020.    Functional Status:  No flowsheet data found.  Fall/Depression Screening: No flowsheet data found. PHQ 2/9 Scores 09/08/2019  PHQ - 2 Score 1    Assessment:  Goals Addressed            This Visit's Progress   . [facility] - Make and Keep All Appointments   On track    Follow Up Date 02/06/2020  Timeframe:  Short-Term Goal Priority:  Medium Start Date:        01/09/2020                     Expected End Date:      02/09/2020                   - ask family or friend for a ride - call to cancel if needed - keep a calendar with appointment dates    Why is this important?   Part of staying healthy is seeing the doctor for follow-up care.  If you forget your appointments, there are some things you can do to stay on track.    Notes:   11/22 -  Upcoming appointments reviewed with member  12/20 - Reviewed upcoming ortho appointment with member (1/14)    . THN - Matintain My Quality of Life   On track    Follow Up Date 02/06/2020  Timeframe:  Long-Range Goal Priority:  High Start Date:    01/09/2020                         Expected End Date:  03/11/2020                       - discuss my treatment options with the doctor or nurse - do one enjoyable thing every day - learn something new by asking, reading and searching the Internet every day    Why is this important?   Having a long-term illness can be scary.  It can also be stressful for you and your caregiver.  These steps may help.    Notes:   12/20 - Discussed plan for shoulder surgery repair with member    . COMPLETED: THN - no recurrent falls       Discussed fall prevention  11/22 - re-educated on use of assist devices to decrease fall risk       Plan:  Follow-up:  Patient agrees to Care Plan and Follow-up.   Will follow up within the next month.  Valente David, [place], MSN [place] [phone]

## 2020-01-09 NOTE — Patient Instructions (Signed)
Goals     THN - Make and Keep All Appointments     Follow Up Date 02/06/2020  Timeframe:  Short-Term Goal Priority:  Medium Start Date:        01/09/2020                     Expected End Date:      02/09/2020                   - ask family or friend for a ride - call to cancel if needed - keep a calendar with appointment dates    Why is this important?   Part of staying healthy is seeing the doctor for follow-up care.  If you forget your appointments, there are some things you can do to stay on track.    Notes:   11/22 - Upcoming appointments reviewed with member  12/20 - Reviewed upcoming ortho appointment with member (1/14)     THN - Matintain My Quality of Life     Follow Up Date 02/06/2020  Timeframe:  Long-Range Goal Priority:  High Start Date:    01/09/2020                         Expected End Date:  03/11/2020                       - discuss my treatment options with the doctor or nurse - do one enjoyable thing every day - learn something new by asking, reading and searching the Internet every day    Why is this important?   Having a long-term illness can be scary.  It can also be stressful for you and your caregiver.  These steps may help.    Notes:   12/20 - Discussed plan for shoulder surgery repair with member

## 2020-01-25 DIAGNOSIS — J40 Bronchitis, not specified as acute or chronic: Secondary | ICD-10-CM | POA: Diagnosis not present

## 2020-02-03 DIAGNOSIS — S42211A Unspecified displaced fracture of surgical neck of right humerus, initial encounter for closed fracture: Secondary | ICD-10-CM | POA: Diagnosis not present

## 2020-02-06 ENCOUNTER — Other Ambulatory Visit: Payer: Self-pay | Admitting: *Deleted

## 2020-02-06 NOTE — Patient Outreach (Signed)
[place] [facility]) Care Management  02/06/2020  Mary Weeks 1945-05-12 [id number]   Outgoing call placed to member to follow up on plan for shoulder fracture recovery.  State she was seen last week by ortho specialist, shoulder has healed, will not require surgery.  There are still bone fragments, but physician feel having surgery will do more harm than good.  She is no longer having PT, does not feel she need it anymore.  She will continue to do the exercises provided by home health team, will also continue to follow up with ortho on an as needed basis.  Denies any urgent concerns, encouraged to contact this care manager with question.  Chronic medical conditions are stable, agrees to have ongoing follow up for disease management.  Will place referral to health coach and notify PCP of transition.  Goals Addressed            This Visit's Progress   . COMPLETED: [facility] - Make and Keep All Appointments       Follow Up Date 02/06/2020  Timeframe:  Short-Term Goal Priority:  Medium Start Date:        01/09/2020                     Expected End Date:      02/09/2020                   - ask family or friend for a ride - call to cancel if needed - keep a calendar with appointment dates    Why is this important?   Part of staying healthy is seeing the doctor for follow-up care.  If you forget your appointments, there are some things you can do to stay on track.    Notes:   11/22 - Upcoming appointments reviewed with member  12/20 - Reviewed upcoming ortho appointment with member (1/14)    . THN - Matintain My Quality of Life   On track      Timeframe:  Long-Range Goal Priority:  High Start Date:    01/09/2020                         Expected End Date:  03/11/2020                       - discuss my treatment options with the doctor or nurse - do one enjoyable thing every day - learn something new by asking, reading and searching the Internet every day    Why is this  important?   Having a long-term illness can be scary.  It can also be stressful for you and your caregiver.  These steps may help.    Notes:   12/20 - Discussed plan for shoulder surgery repair with member      Valente David, RN, MSN [place] Manager [phone]

## 2020-02-10 ENCOUNTER — Encounter: Payer: Self-pay | Admitting: *Deleted

## 2020-02-10 NOTE — Telephone Encounter (Signed)
This encounter was created in error - please disregard.

## 2020-02-13 ENCOUNTER — Other Ambulatory Visit: Payer: Self-pay | Admitting: *Deleted

## 2020-02-14 DIAGNOSIS — R531 Weakness: Secondary | ICD-10-CM | POA: Diagnosis not present

## 2020-02-14 DIAGNOSIS — I10 Essential (primary) hypertension: Secondary | ICD-10-CM | POA: Diagnosis not present

## 2020-02-14 DIAGNOSIS — M810 Age-related osteoporosis without current pathological fracture: Secondary | ICD-10-CM | POA: Diagnosis not present

## 2020-02-14 DIAGNOSIS — S42211D Unspecified displaced fracture of surgical neck of right humerus, subsequent encounter for fracture with routine healing: Secondary | ICD-10-CM | POA: Diagnosis not present

## 2020-02-14 DIAGNOSIS — I4891 Unspecified atrial fibrillation: Secondary | ICD-10-CM | POA: Diagnosis not present

## 2020-02-16 DIAGNOSIS — R531 Weakness: Secondary | ICD-10-CM | POA: Diagnosis not present

## 2020-02-16 DIAGNOSIS — I10 Essential (primary) hypertension: Secondary | ICD-10-CM | POA: Diagnosis not present

## 2020-02-16 DIAGNOSIS — S42211D Unspecified displaced fracture of surgical neck of right humerus, subsequent encounter for fracture with routine healing: Secondary | ICD-10-CM | POA: Diagnosis not present

## 2020-02-16 DIAGNOSIS — M810 Age-related osteoporosis without current pathological fracture: Secondary | ICD-10-CM | POA: Diagnosis not present

## 2020-02-16 DIAGNOSIS — I4891 Unspecified atrial fibrillation: Secondary | ICD-10-CM | POA: Diagnosis not present

## 2020-02-20 DIAGNOSIS — J40 Bronchitis, not specified as acute or chronic: Secondary | ICD-10-CM | POA: Diagnosis not present

## 2020-02-21 DIAGNOSIS — I4891 Unspecified atrial fibrillation: Secondary | ICD-10-CM | POA: Diagnosis not present

## 2020-02-21 DIAGNOSIS — M810 Age-related osteoporosis without current pathological fracture: Secondary | ICD-10-CM | POA: Diagnosis not present

## 2020-02-21 DIAGNOSIS — R531 Weakness: Secondary | ICD-10-CM | POA: Diagnosis not present

## 2020-02-21 DIAGNOSIS — S42211D Unspecified displaced fracture of surgical neck of right humerus, subsequent encounter for fracture with routine healing: Secondary | ICD-10-CM | POA: Diagnosis not present

## 2020-02-21 DIAGNOSIS — I10 Essential (primary) hypertension: Secondary | ICD-10-CM | POA: Diagnosis not present

## 2020-02-23 DIAGNOSIS — I10 Essential (primary) hypertension: Secondary | ICD-10-CM | POA: Diagnosis not present

## 2020-02-23 DIAGNOSIS — M810 Age-related osteoporosis without current pathological fracture: Secondary | ICD-10-CM | POA: Diagnosis not present

## 2020-02-23 DIAGNOSIS — S42211D Unspecified displaced fracture of surgical neck of right humerus, subsequent encounter for fracture with routine healing: Secondary | ICD-10-CM | POA: Diagnosis not present

## 2020-02-23 DIAGNOSIS — R531 Weakness: Secondary | ICD-10-CM | POA: Diagnosis not present

## 2020-02-23 DIAGNOSIS — I4891 Unspecified atrial fibrillation: Secondary | ICD-10-CM | POA: Diagnosis not present

## 2020-02-28 ENCOUNTER — Ambulatory Visit: Payer: Medicare Other | Admitting: *Deleted

## 2020-02-28 DIAGNOSIS — S42211D Unspecified displaced fracture of surgical neck of right humerus, subsequent encounter for fracture with routine healing: Secondary | ICD-10-CM | POA: Diagnosis not present

## 2020-02-28 DIAGNOSIS — M810 Age-related osteoporosis without current pathological fracture: Secondary | ICD-10-CM | POA: Diagnosis not present

## 2020-02-28 DIAGNOSIS — R531 Weakness: Secondary | ICD-10-CM | POA: Diagnosis not present

## 2020-02-28 DIAGNOSIS — I10 Essential (primary) hypertension: Secondary | ICD-10-CM | POA: Diagnosis not present

## 2020-02-28 DIAGNOSIS — I4891 Unspecified atrial fibrillation: Secondary | ICD-10-CM | POA: Diagnosis not present

## 2020-02-29 ENCOUNTER — Other Ambulatory Visit: Payer: Self-pay | Admitting: *Deleted

## 2020-02-29 NOTE — Patient Outreach (Signed)
[facility]) Care Management  02/29/2020  Mary Weeks 01-May-1945 [id number]  Outreach attempt to patient. No answer and unable to leave voicemail message due to received a busy signal x3 when dialing patient's number.  Plan: RN Health Coach will call patient within the month of March.  Emelia Loron RN, BSN [place] [phone] [email or web address]

## 2020-03-05 DIAGNOSIS — M810 Age-related osteoporosis without current pathological fracture: Secondary | ICD-10-CM | POA: Diagnosis not present

## 2020-03-05 DIAGNOSIS — S42211D Unspecified displaced fracture of surgical neck of right humerus, subsequent encounter for fracture with routine healing: Secondary | ICD-10-CM | POA: Diagnosis not present

## 2020-03-05 DIAGNOSIS — I10 Essential (primary) hypertension: Secondary | ICD-10-CM | POA: Diagnosis not present

## 2020-03-05 DIAGNOSIS — R531 Weakness: Secondary | ICD-10-CM | POA: Diagnosis not present

## 2020-03-05 DIAGNOSIS — I4891 Unspecified atrial fibrillation: Secondary | ICD-10-CM | POA: Diagnosis not present

## 2020-03-13 DIAGNOSIS — I10 Essential (primary) hypertension: Secondary | ICD-10-CM | POA: Diagnosis not present

## 2020-03-13 DIAGNOSIS — I4891 Unspecified atrial fibrillation: Secondary | ICD-10-CM | POA: Diagnosis not present

## 2020-03-13 DIAGNOSIS — R531 Weakness: Secondary | ICD-10-CM | POA: Diagnosis not present

## 2020-03-13 DIAGNOSIS — M810 Age-related osteoporosis without current pathological fracture: Secondary | ICD-10-CM | POA: Diagnosis not present

## 2020-03-13 DIAGNOSIS — S42211D Unspecified displaced fracture of surgical neck of right humerus, subsequent encounter for fracture with routine healing: Secondary | ICD-10-CM | POA: Diagnosis not present

## 2020-03-21 DIAGNOSIS — S42211D Unspecified displaced fracture of surgical neck of right humerus, subsequent encounter for fracture with routine healing: Secondary | ICD-10-CM | POA: Diagnosis not present

## 2020-03-21 DIAGNOSIS — R531 Weakness: Secondary | ICD-10-CM | POA: Diagnosis not present

## 2020-03-21 DIAGNOSIS — I4891 Unspecified atrial fibrillation: Secondary | ICD-10-CM | POA: Diagnosis not present

## 2020-03-21 DIAGNOSIS — I10 Essential (primary) hypertension: Secondary | ICD-10-CM | POA: Diagnosis not present

## 2020-03-21 DIAGNOSIS — M810 Age-related osteoporosis without current pathological fracture: Secondary | ICD-10-CM | POA: Diagnosis not present

## 2020-04-03 DIAGNOSIS — Z0000 Encounter for general adult medical examination without abnormal findings: Secondary | ICD-10-CM | POA: Diagnosis not present

## 2020-04-03 DIAGNOSIS — J40 Bronchitis, not specified as acute or chronic: Secondary | ICD-10-CM | POA: Diagnosis not present

## 2020-04-03 DIAGNOSIS — K219 Gastro-esophageal reflux disease without esophagitis: Secondary | ICD-10-CM | POA: Diagnosis not present

## 2020-04-03 DIAGNOSIS — I10 Essential (primary) hypertension: Secondary | ICD-10-CM | POA: Diagnosis not present

## 2020-04-03 DIAGNOSIS — E7849 Other hyperlipidemia: Secondary | ICD-10-CM | POA: Diagnosis not present

## 2020-04-09 ENCOUNTER — Other Ambulatory Visit: Payer: Self-pay | Admitting: *Deleted

## 2020-04-09 ENCOUNTER — Encounter: Payer: Self-pay | Admitting: *Deleted

## 2020-04-09 NOTE — Patient Instructions (Addendum)
Goals Addressed            This Visit's Progress   . [facility]) Patient will verbalize monitoring pulse and B/P daily and recording the values within the next 90 days.       Timeframe:  Long-Range Goal Priority:  High Start Date: 04/09/20                            Expected End Date: 04/18/21                      Follow Up Date 07/19/20    - check pulse (heart) rate once a day - make a plan to exercise regularly - make a plan to eat healthy - take medicine as prescribed  -Discussed monitoring B/P, pulse, and recording the values in calendar book   Why is this important?    Atrial fibrillation may have no symptoms. Sometimes the symptoms get worse or happen more often.   It is important to keep track of what your symptoms are and when they happen.   A change in symptoms is important to discuss with your doctor or nurse.   Being active and healthy eating will also help you manage your heart condition.     Notes: Patient states she does not have a B/P monitor to monitor pulse and B/P. Nurse will send patient B/P monitor, calendar booklet, and atrial fibrillation education. Patient states once she receives these items she will begin to take her pulse, B/P, and record the values daily.    . COMPLETED: [facility] - Matintain My Quality of Life         Timeframe:  Long-Range Goal Priority:  High Start Date:    01/09/2020                         Expected End Date:  03/11/2020                       - discuss my treatment options with the doctor or nurse - do one enjoyable thing every day - learn something new by asking, reading and searching the Internet every day    Why is this important?   Having a long-term illness can be scary.  It can also be stressful for you and your caregiver.  These steps may help.    Notes:   12/20 - Discussed plan for shoulder surgery repair with member 02/06/20: [facility] CM confirmed patient would not have any further shoulder surgery and closed case

## 2020-04-09 NOTE — Patient Outreach (Signed)
[facility] [facility]) Care Management  [place]  04/09/2020   Mary Weeks 02/12/1945 [id number]  Subjective: Successful telephone outreach call to patient. HIPAA identifiers obtained. Patient shared that she was concerned about her mother who is currently in the hospital with a cervical fracture. Nurse provided emotional support to the patient. Patient explains that she wants education regarding her Afib. Currently she does not have a B/P monitor and takes her pulse manually when she feels like her heart is racing. When she assesses her heart rate she typically gets values in the 120's. Patient was not able to tolerate Xarelto because it made her bleed. She currently takes aspirin as a blood thinner. Nurse provided education and patient does want to receive Afib education booklet, B/P monitor, and a calendar booklet to record the values. Patient explains that her shoulder continues to be painful and she does the PT exercises about 2 times weekly to maintain ROM. She states she has not had any falls since she fell and fractured her shoulder and she currently feels steady on her feet. She is independent with ADLs and IADLs and explains she has good support from her son and family. Patient did not have any further questions or concerns today.    Encounter Medications:  Outpatient Encounter Medications as of 04/09/2020  Medication Sig Note  . aspirin 81 MG chewable tablet Chew 81 mg by mouth daily.   . busPIRone (BUSPAR) 15 MG tablet Take 15 mg by mouth 2 (two) times daily.   . carvedilol (COREG) 3.125 MG tablet Take 1 tablet (3.125 mg total) by mouth 2 (two) times daily with a meal.   . Cholecalciferol (VITAMIN D3) 2000 UNITS capsule Take 2,000 Units by mouth daily.   . colestipol (COLESTID) 1 g tablet Take 1 g by mouth 2 (two) times daily.   Marland Kitchen diltiazem (CARDIZEM) 30 MG tablet Take 1 tablet (30 mg total) by mouth every 12 (twelve) hours.   Marland Kitchen FLUoxetine (PROZAC) 20 MG capsule Take 20  mg by mouth daily.   Marland Kitchen gabapentin (NEURONTIN) 300 MG capsule Take 1 capsule (300 mg total) by mouth at bedtime. 01/09/2020: Taking 3 times a day  . potassium chloride SA (K-DUR,KLOR-CON) 20 MEQ tablet Take 1 tablet (20 mEq total) by mouth daily.   . rosuvastatin (CRESTOR) 10 MG tablet Take 10 mg by mouth daily.   Marland Kitchen tiZANidine (ZANAFLEX) 4 MG tablet Take 4 mg by mouth 2 (two) times daily.   . traMADol (ULTRAM) 50 MG tablet Take 50 mg by mouth in the morning, at noon, and at bedtime.   . rivaroxaban (XARELTO) 20 MG TABS tablet Take 1 tablet (20 mg total) by mouth daily. (Patient not taking: No sig reported) 04/09/2020: Made patient bleed   No facility-administered encounter medications on file as of 04/09/2020.    Functional Status:  No flowsheet data found.  Fall/Depression Screening: Fall Risk  04/09/2020  Falls in the past year? 1  Number falls in past yr: 0  Injury with Fall? 1  Risk for fall due to : History of fall(s);Medication side effect  Follow up Falls prevention discussed;Education provided;Falls evaluation completed   PHQ 2/9 Scores 04/09/2020 09/08/2019  PHQ - 2 Score 0 1    Assessment:  Goals Addressed            This Visit's Progress   . [facility]) Patient will verbalize monitoring pulse and B/P daily and recording the values within the next 90 days.  Timeframe:  Long-Range Goal Priority:  High Start Date: 04/09/20                            Expected End Date: 04/18/21                      Follow Up Date 07/19/20    - check pulse (heart) rate once a day - make a plan to exercise regularly - make a plan to eat healthy - take medicine as prescribed  -Discussed monitoring B/P, pulse, and recording the values in calendar book   Why is this important?    Atrial fibrillation may have no symptoms. Sometimes the symptoms get worse or happen more often.   It is important to keep track of what your symptoms are and when they happen.   A change in symptoms is important  to discuss with your doctor or nurse.   Being active and healthy eating will also help you manage your heart condition.     Notes: Patient states she does not have a B/P monitor to monitor pulse and B/P. Nurse will send patient B/P monitor, calendar booklet, and atrial fibrillation education. Patient states once she receives these items she will begin to take her pulse, B/P, and record the values daily.    . COMPLETED: [facility] - [facility] My Quality of Life         Timeframe:  Long-Range Goal Priority:  High Start Date:    01/09/2020                         Expected End Date:  03/11/2020                       - discuss my treatment options with the doctor or nurse - do one enjoyable thing every day - learn something new by asking, reading and searching the Internet every day    Why is this important?   Having a long-term illness can be scary.  It can also be stressful for you and your caregiver.  These steps may help.    Notes:   12/20 - Discussed plan for shoulder surgery repair with member 02/06/20: [facility] CM confirmed patient would not have any further shoulder surgery and closed case      Plan: RN Health Coach will send PCP and barrier letter and today's assessment note, will send patient Afib education, B/P monitor, and a calendar booklet, and will call patient within the month of May. Follow-up:  Patient agrees to Care Plan and Follow-up.  Emelia Loron RN, BSN [place] [phone] [email or web address]

## 2020-04-10 DIAGNOSIS — M810 Age-related osteoporosis without current pathological fracture: Secondary | ICD-10-CM | POA: Diagnosis not present

## 2020-04-10 DIAGNOSIS — M85851 Other specified disorders of bone density and structure, right thigh: Secondary | ICD-10-CM | POA: Diagnosis not present

## 2020-04-10 DIAGNOSIS — Z13820 Encounter for screening for osteoporosis: Secondary | ICD-10-CM | POA: Diagnosis not present

## 2020-04-20 DIAGNOSIS — X58XXXD Exposure to other specified factors, subsequent encounter: Secondary | ICD-10-CM | POA: Diagnosis not present

## 2020-04-20 DIAGNOSIS — G8929 Other chronic pain: Secondary | ICD-10-CM | POA: Diagnosis not present

## 2020-04-20 DIAGNOSIS — S42294D Other nondisplaced fracture of upper end of right humerus, subsequent encounter for fracture with routine healing: Secondary | ICD-10-CM | POA: Diagnosis not present

## 2020-04-20 DIAGNOSIS — M25511 Pain in right shoulder: Secondary | ICD-10-CM | POA: Diagnosis not present

## 2020-05-22 ENCOUNTER — Other Ambulatory Visit: Payer: Self-pay | Admitting: *Deleted

## 2020-05-22 NOTE — Patient Outreach (Signed)
[place] [facility]) Care Management  05/22/2020  Mary Weeks 1945/12/14 [id number]  Unsuccessful outreach attempt made to patient. RN Health Coach left HIPAA compliant voicemail message along with her contact information.  Plan: RN Health Coach will call patient within the month of June.  Emelia Loron RN, BSN [place] [phone] [email or web address]

## 2020-05-31 ENCOUNTER — Ambulatory Visit: Payer: Self-pay | Admitting: *Deleted

## 2020-07-04 ENCOUNTER — Other Ambulatory Visit: Payer: Self-pay | Admitting: *Deleted

## 2020-07-04 NOTE — Patient Instructions (Addendum)
Goals Addressed             This Visit's Progress    [facility]) Patient will verbalize continuation of monitoring pulse and B/P daily and recording the values for the next 90 days.       Timeframe:  Long-Range Goal Priority:  High Start Date: 04/09/20                            Expected End Date: 04/18/21                      Follow Up Date 10/19/20    - check pulse (heart) rate once a day - make a plan to exercise regularly - make a plan to eat healthy - take medicine as prescribed  -Discussed monitoring B/P, pulse, and recording the values in calendar book -Discussed taking her pulse and B/P when she experiences feelings of her heart racing to monitor for high pulse rate and abnormal B/P.  Why is this important?   Atrial fibrillation may have no symptoms. Sometimes the symptoms get worse or happen more often.  It is important to keep track of what your symptoms are and when they happen.  A change in symptoms is important to discuss with your doctor or nurse.  Being active and healthy eating will also help you manage your heart condition.     Notes: Patient states she does not have a B/P monitor to monitor pulse and B/P. Nurse will send patient B/P monitor, calendar booklet, and atrial fibrillation education. Patient states once she receives these items she will begin to take her pulse, B/P, and record the values daily.  Updated 07/04/20: Patient states that she did receive the B/P monitor, calendar booklet, and Afib education this nurse sent previously. She reports taking her B/P and pulse daily and explains that they are under control presently. Patient explains that her heart feels like it is racing occasionally and this nurse advised her to take her pulse and B/P when this happens to evaluate for high pulse rate and abnormal B/P.

## 2020-07-04 NOTE — Patient Outreach (Signed)
[facility]) Care Management  [employer]  07/04/2020   Mary Weeks [birth year] [id number]  Subjective: Successful telephone outreach call to patient. HIPAA identifiers obtained. Patient explained that she heard a pop sound and had shoulder pain when she was taking off her shirt several days ago. Since that time, patient explains that her shoulder pain has worsen. Patient reports that she does currently have good ROM. Nurse advised the patient to contact Dr. Sheliah Plane to inform him of this issue. Patient verbalize understanding. Patient states that she did receive the B/P monitor, calendar booklet, and Afib education this nurse sent previously. She reports taking her B/P and pulse daily and explains that they are under control presently. Patient explains that her heart feels like it is racing occasionally and this nurse advised her to take her pulse and B/P when this happens to evaluate for high pulse rate and abnormal B/P. Patient did not have any further questions or concerns today and did confirm that she has this nurse's contact number to call her if needed.   Encounter Medications:  Outpatient Encounter Medications as of 07/04/2020  Medication Sig Note   aspirin 81 MG chewable tablet Chew 81 mg by mouth daily.    busPIRone (BUSPAR) 15 MG tablet Take 15 mg by mouth 2 (two) times daily.    carvedilol (COREG) 3.125 MG tablet Take 1 tablet (3.125 mg total) by mouth 2 (two) times daily with a meal.    Cholecalciferol (VITAMIN D3) 2000 UNITS capsule Take 2,000 Units by mouth daily.    colestipol (COLESTID) 1 g tablet Take 1 g by mouth 2 (two) times daily.    diltiazem (CARDIZEM) 30 MG tablet Take 1 tablet (30 mg total) by mouth every 12 (twelve) hours.    FLUoxetine (PROZAC) 20 MG capsule Take 20 mg by mouth daily.    gabapentin (NEURONTIN) 300 MG capsule Take 1 capsule (300 mg total) by mouth at bedtime. 01/09/2020: Taking 3 times a day   potassium chloride SA  (K-DUR,KLOR-CON) 20 MEQ tablet Take 1 tablet (20 mEq total) by mouth daily.    rosuvastatin (CRESTOR) 10 MG tablet Take 10 mg by mouth daily.    tiZANidine (ZANAFLEX) 4 MG tablet Take 4 mg by mouth 2 (two) times daily.    traMADol (ULTRAM) 50 MG tablet Take 50 mg by mouth in the morning, at noon, and at bedtime.    rivaroxaban (XARELTO) 20 MG TABS tablet Take 1 tablet (20 mg total) by mouth daily. (Patient not taking: No sig reported) 04/09/2020: Made patient bleed   No facility-administered encounter medications on file as of 07/04/2020.    Functional Status:  No flowsheet data found.  Fall/Depression Screening: Fall Risk  07/04/2020 04/09/2020  Falls in the past year? 1 1  Number falls in past yr: 0 0  Injury with Fall? 1 1  Risk for fall due to : History of fall(s);Medication side effect History of fall(s);Medication side effect  Follow up Falls prevention discussed;Education provided;Falls evaluation completed Falls prevention discussed;Education provided;Falls evaluation completed   PHQ 2/9 Scores 04/09/2020 09/08/2019  PHQ - 2 Score 0 1    Assessment:   Care Plan Care Plan : Atrial Fibrillation (Adult)  Updates made by Michiel Cowboy, RN since 07/04/2020 12:00 AM     Problem: Stroke Risk (Atrial Fibrillation)   Priority: Medium       Goals Addressed             This Visit's Progress    (  [facility]) Patient will verbalize continuation of monitoring pulse and B/P daily and recording the values for the next 90 days.       Timeframe:  Long-Range Goal Priority:  High Start Date: 04/09/20                            Expected End Date: 04/18/21                      Follow Up Date 10/19/20    - check pulse (heart) rate once a day - make a plan to exercise regularly - make a plan to eat healthy - take medicine as prescribed  -Discussed monitoring B/P, pulse, and recording the values in calendar book -Discussed taking her pulse and B/P when she experiences feelings of her heart racing  to monitor for high pulse rate and abnormal B/P.  Why is this important?   Atrial fibrillation may have no symptoms. Sometimes the symptoms get worse or happen more often.  It is important to keep track of what your symptoms are and when they happen.  A change in symptoms is important to discuss with your doctor or nurse.  Being active and healthy eating will also help you manage your heart condition.     Notes: Patient states she does not have a B/P monitor to monitor pulse and B/P. Nurse will send patient B/P monitor, calendar booklet, and atrial fibrillation education. Patient states once she receives these items she will begin to take her pulse, B/P, and record the values daily.  Updated 07/04/20: Patient states that she did receive the B/P monitor, calendar booklet, and Afib education this nurse sent previously. She reports taking her B/P and pulse daily and explains that they are under control presently. Patient explains that her heart feels like it is racing occasionally and this nurse advised her to take her pulse and B/P when this happens to evaluate for high pulse rate and abnormal B/P.        Plan: RN Health Coach will send PCP a quarterly update and call patient within the month of September. Follow-up: Patient agrees to Care Plan and Follow-up.   Emelia Loron RN, BSN [place] [phone] [email or web address]

## 2020-08-07 DIAGNOSIS — E7849 Other hyperlipidemia: Secondary | ICD-10-CM | POA: Diagnosis not present

## 2020-08-07 DIAGNOSIS — I10 Essential (primary) hypertension: Secondary | ICD-10-CM | POA: Diagnosis not present

## 2020-08-07 DIAGNOSIS — Z0000 Encounter for general adult medical examination without abnormal findings: Secondary | ICD-10-CM | POA: Diagnosis not present

## 2020-08-07 DIAGNOSIS — K219 Gastro-esophageal reflux disease without esophagitis: Secondary | ICD-10-CM | POA: Diagnosis not present

## 2020-09-04 DIAGNOSIS — E7849 Other hyperlipidemia: Secondary | ICD-10-CM | POA: Diagnosis not present

## 2020-09-04 DIAGNOSIS — K219 Gastro-esophageal reflux disease without esophagitis: Secondary | ICD-10-CM | POA: Diagnosis not present

## 2020-09-04 DIAGNOSIS — K2970 Gastritis, unspecified, without bleeding: Secondary | ICD-10-CM | POA: Diagnosis not present

## 2020-09-04 DIAGNOSIS — I10 Essential (primary) hypertension: Secondary | ICD-10-CM | POA: Diagnosis not present

## 2020-09-19 DIAGNOSIS — E7849 Other hyperlipidemia: Secondary | ICD-10-CM | POA: Diagnosis not present

## 2020-09-19 DIAGNOSIS — K219 Gastro-esophageal reflux disease without esophagitis: Secondary | ICD-10-CM | POA: Diagnosis not present

## 2020-09-19 DIAGNOSIS — I10 Essential (primary) hypertension: Secondary | ICD-10-CM | POA: Diagnosis not present

## 2020-10-05 ENCOUNTER — Other Ambulatory Visit: Payer: Self-pay | Admitting: *Deleted

## 2020-10-05 NOTE — Patient Outreach (Signed)
[facility]) Care Management  10/05/2020  Mary Weeks September 07, 1945 [id number]  Unsuccessful outreach attempt made to patient. Patient's mother answered the phone and stated that the patient is not home. Nurse did leave a message with the mother to let the patient know this nurse will call her back at a later date.   Plan: RN Health Coach will call patient within the month of October.  Mary Loron RN, BSN [place] [phone] Mary Weeks

## 2020-10-10 DIAGNOSIS — J208 Acute bronchitis due to other specified organisms: Secondary | ICD-10-CM | POA: Diagnosis not present

## 2020-10-10 DIAGNOSIS — M545 Low back pain, unspecified: Secondary | ICD-10-CM | POA: Diagnosis not present

## 2020-10-31 ENCOUNTER — Other Ambulatory Visit: Payer: Self-pay | Admitting: *Deleted

## 2020-10-31 NOTE — Patient Outreach (Addendum)
[facility]) Care Management  10/31/2020  Mary Weeks 1945/04/11 [id number]  Outreach attempt to patient. No answer and unable to leave voicemail message due to patient's phone rang 12 times without an answer, unable to leave VM.  Plan: RN Health Coach will call patient within the month of November.  Emelia Loron RN, BSN [place] [phone] [name]

## 2020-11-05 DIAGNOSIS — W19XXXA Unspecified fall, initial encounter: Secondary | ICD-10-CM | POA: Diagnosis not present

## 2020-11-05 DIAGNOSIS — T148XXA Other injury of unspecified body region, initial encounter: Secondary | ICD-10-CM | POA: Diagnosis not present

## 2020-11-05 DIAGNOSIS — R5381 Other malaise: Secondary | ICD-10-CM | POA: Diagnosis not present

## 2020-12-03 ENCOUNTER — Other Ambulatory Visit: Payer: Self-pay | Admitting: *Deleted

## 2020-12-03 NOTE — Patient Outreach (Signed)
[facility]) Care Management  12/03/2020  Mary Weeks 04/07/1945 [id number]  Unsuccessful outreach attempt made to patient. RN Health Coach left HIPAA compliant voicemail message along with her contact information.  Plan: RN Health Coach will call patient within the month of December.  Emelia Loron RN, BSN [place] [phone] [name]

## 2021-01-03 DIAGNOSIS — J208 Acute bronchitis due to other specified organisms: Secondary | ICD-10-CM | POA: Diagnosis not present

## 2021-01-17 ENCOUNTER — Other Ambulatory Visit: Payer: Self-pay | Admitting: *Deleted

## 2021-01-17 NOTE — Patient Outreach (Signed)
[facility] [facility]) Care Management  01/17/2021  Chrissi Crow 03/12/1945 [id number]  Unsuccessful outreach attempt made to patient. RN Health Coach left HIPAA compliant voicemail message along with her contact information.  Plan: RN Health Coach will call patient within the month of January.  Emelia Loron RN, BSN [place] [phone] [email or web address]

## 2021-02-13 ENCOUNTER — Other Ambulatory Visit: Payer: Self-pay | Admitting: *Deleted

## 2021-02-13 ENCOUNTER — Encounter: Payer: Self-pay | Admitting: *Deleted

## 2021-02-13 DIAGNOSIS — I10 Essential (primary) hypertension: Secondary | ICD-10-CM

## 2021-02-13 NOTE — Patient Instructions (Addendum)
Visit Information  Thank you for taking time to visit with me today. Please don't hesitate to contact me if I can be of assistance to you before our next scheduled telephone appointment.  Following are the goals we discussed today:  Patient Goals/Self-Care Activities: Take all medications as prescribed Attend all scheduled provider appointments Call provider office for new concerns or questions  check pulse (heart) rate once a day make a plan to exercise regularly make a plan to eat healthy take medicine as prescribed Eat a low cholesterol, heart healthy diet Continue to take pulse and B/P daily and record the values Take your pulse when you feel your heart racing/fluttering; record the value; contact PCP for high pulse rates especially if 120 or greater Contact PCP for frequent events of your heart racing/fluttering  The patient verbalized understanding of instructions, educational materials, and care plan provided today and agreed to receive a mailed copy of patient instructions, educational materials, and care plan.   Telephone follow up appointment with care management team member scheduled for: April  Emelia Loron RN, [place] [phone] [email or web address]

## 2021-02-13 NOTE — Patient Outreach (Signed)
[facility]) Care Management  02/13/2021  Mary Weeks Mar 03, 1945 [id number]  [facility]) Care Management RN Health Coach Note   02/13/2021 Name:  Mary Weeks MRN:  [id number] DOB:  [birth year]  Summary: Patient states she is feeling fairly well. She continues to have chronic pain in her shoulder from previous fracture. Patient states she is doing her shoulder exercises and she has good ROM and strength in that extremity. Nurse advised her to inform her orthopedist as needed. Patient continues to take her pulse and B/P daily. She states her pulse ranges are 80-100 and her B/P  ranges are in  the systolic 073'X to diastolic 10'G. 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 Nurse will send referral to care guide coordinator to inquire about respite assistance.   Recommendations/Changes made from today's visit: Eat a low cholesterol, heart healthy diet Continue to take pulse and B/P daily and record the values Take your pulse when you feel your heart racing/fluttering; record the value; contact PCP for high pulse rates especially if 120 or greater Contact PCP for frequent events of your heart racing/fluttering   Subjective: Mary Weeks is an 76 y.o. year old female who is a primary patient of Hasanaj, Samul Dada, MD. The care management team was consulted for assistance with care management and/or care coordination needs.    RN Health Coach completed Telephone Visit today.   Objective:  Medications Reviewed Today     Reviewed by Michiel Cowboy, RN (Registered Nurse) on 02/13/21 at 77  Med List Status: <None>   Medication Order Taking? Sig Documenting Provider Last Dose Status Informant  aspirin 81 MG chewable tablet [id number] Yes Chew 81 mg by mouth daily. [provider] Taking Active Self  busPIRone (BUSPAR) 15 MG tablet [id number] Yes Take 15 mg by mouth 2 (two) times daily.  [provider] Taking Active Self  carvedilol (COREG) 3.125 MG tablet [id number] Yes Take 1 tablet (3.125 mg total) by mouth 2 (two) times daily with a meal. Dixie Dials, MD Taking Active   Cholecalciferol (VITAMIN D3) 2000 UNITS capsule [id number] Yes Take 2,000 Units by mouth daily. [provider] Taking Active Self  colestipol (COLESTID) 1 g tablet [id number] Yes Take 1 g by mouth 2 (two) times daily. [provider] Taking Active Self  diltiazem (CARDIZEM) 30 MG tablet [id number] Yes Take 1 tablet (30 mg total) by mouth every 12 (twelve) hours. Dixie Dials, MD Taking Active   FLUoxetine (PROZAC) 20 MG capsule [id number] Yes Take 20 mg by mouth daily. [provider] Taking Active Self  gabapentin (NEURONTIN) 300 MG capsule [id number] Yes Take 1 capsule (300 mg total) by mouth at bedtime. Dixie Dials, MD Taking Active            Med Note Orene Desanctis, MONICA   Mon Jan 09, 2020 11:01 AM) Taking 3 times a day  potassium chloride SA (K-DUR,KLOR-CON) 20 MEQ tablet [id number] Yes Take 1 tablet (20 mEq total) by mouth daily. Dixie Dials, MD Taking Active   rivaroxaban (XARELTO) 20 MG TABS tablet [id number] No Take 1 tablet (20 mg total) by mouth daily.  Patient not taking: Reported on 01/09/2020   Dixie Dials, MD Not Taking Active            Med Note Laretta Alstrom, Chrsitopher Wik A   Mon Apr 09, 2020 10:05 AM) Made patient bleed  rosuvastatin (CRESTOR) 10 MG tablet [id number] Yes Take 10 mg by  mouth daily. [provider] Taking Active Self  tiZANidine (ZANAFLEX) 4 MG tablet [id number] Yes Take 4 mg by mouth 2 (two) times daily. [provider] Taking Active Self  traMADol (ULTRAM) 50 MG tablet [id number] Yes Take 50 mg by mouth in the morning, at noon, and at bedtime. [provider] Taking Active Self             SDOH:  (Social Determinants of Health) assessments and interventions performed: SDOH assessments completed today and documented in the Epic  system.    Care Plan  Review of patient past medical history, allergies, medications, health status, including review of consultants reports, laboratory and other test data, was performed as part of comprehensive evaluation for care management services.   Care Plan : General Plan of Care (Adult)  Updates made by Michiel Cowboy, RN since 02/13/2021 12:00 AM     Problem: Health Promotion or Disease Self-Management (General Plan of Care) Resolved 02/13/2021     Long-Range Goal: Self-Management Plan Developed Completed 02/13/2021  Start Date: 11/03/2019  Expected End Date: 03/09/2020  Recent Progress: On track  Note:   Resolving due to duplicate goal  Evidence-based guidance:  Review biopsychosocial determinants of health screens.  Determine level of modifiable health risk.  Assess level of patient activation, level of readiness, importance and confidence to make changes.  Evoke change talk using open-ended questions, pros and cons, as well as looking forward.  Identify areas where behavior change may lead to improved health.  Partner with patient to develop a robust self-management plan that includes lifestyle factors, such as weight loss, exercise and healthy nutrition, as well as goals specific to disease risks.  Support patient and family/caregiver active participation in decision-making and self-management plan.  Implement additional goals and interventions based on identified risk factors to reduce health risk.  Facilitate advance care planning.  Review need for preventive screening based on age, sex, family history and health history.   Notes:     Problem: Quality of Life (General Plan of Care) Resolved 02/13/2021     Care Plan : Atrial Fibrillation (Adult)  Updates made by Michiel Cowboy, RN since 02/13/2021 12:00 AM     Problem: Dysrhythmia (Atrial Fibrillation) Resolved 02/13/2021  Priority: Medium     Long-Range Goal: Heart Rate and Rhythm Monitored and Managed Completed  02/13/2021  Start Date: 04/09/2020  Expected End Date: 04/18/2021  Note:   Resolving due to duplicate goal  Evidence-based guidance:  Assess heart rate, rhythm and presence of symptoms at each encounter using pulse palpation, blood pressure monitor and review of symptom diary.  Consider pulse check plus an electrocardiogram (ECG) in women over the age of 35 years.  Prepare patient for diagnostic studies, such as 24-hour ambulatory monitoring, echocardiogram or implantable loop recorder, based on presentation and risk factors, such as cryptogenic stroke.  Prepare patient for use of pharmacologic therapy, such as beta-blocker, calcium channel blocker, digoxin, antiarrhythmic, based on presentation, risk factors and patient preferences.  Monitor side effects and expect periodic adjustments.  Provide an opportunity for shared decision-making when uncontrolled rate and rhythm persist and invasive therapy is considered, such as left atrial ablation, pacemaker, cardioversion or cardiac resynchronization therapy.  Provide reassurance, as initial symptoms and potential recurrence are frightening to patient and family/caregiver and may impact quality of life.  Provide prompt follow-up after hospitalization to support transition of care; consider referral to home care or community support program, especially in presence of comorbidity.  Encourage exercise 2  to 3 times per week, based on ability and tolerance, to improve physical and cardiac function and quality of life.   Notes:     Task: Alleviate Barriers to Dysrhythmia Management Completed 02/13/2021  Due Date: 04/18/2021  Note:   Care Management Activities:    - activity or exercise based on tolerance encouraged - medication-adherence assessment completed - medication side effects monitored and managed - pulse rate and rhythm assessed - reassurance provided - rescue (action) plan developed - rescue (action) plan use encouraged    Notes:      Problem: Stroke Risk (Atrial Fibrillation) Resolved 02/13/2021  Priority: Medium     Long-Range Goal: Stroke Risk Minimized Completed 02/13/2021  Start Date: 04/09/2020  Expected End Date: 04/18/2021  Note:   Resolving due to duplicate goal  Evidence-based guidance:  Provide anticipatory guidance regarding the signs/symptoms of stroke and heart failure.  Assess bleeding risk using a validated tool; review results with patient.  Assess and review stroke risk using a validated tool that include age and sex differences to determine choice, benefit and timing of anticoagulant therapy.  Prepare patient for use of pharmacologic therapy, such as nonvitamin K oral anticoagulant, vitamin K antagonist, low-molecular-weight heparin or antiplatelet (in combination with another agent) based on stroke risk score.  Monitor side effects and anticipate need for periodic adjustments.  Prepare patient for periodic coagulation laboratory testing when taking vitamin K antagonist; consider at-home monitoring and management.  Review alcohol risk screen or self-report of alcohol use (amount and frequency) when anticoagulant (especially vitamin K antagonist) is prescribed.  Encourage consistent consumption, not restriction, of dietary vitamin K, such as green, leafy vegetables, during warfarin therapy and especially during initiation phase.  Provide anticipatory guidance regarding the risk of bleeding, awareness of signs/symptoms of bleeding and minimizing bleeding risk, including fall and injury prevention.  Perform medication-adherence assessment when taking oral anticoagulants that do not require periodic monitoring or when poor anticoagulation control based on monitoring is noted.  Address barriers to treatment adherence, such as cognitive function, illness, drug interaction, medication cost, presence of depression or anxiety and lifestyle factors, including diet and alcohol consumption.  Promote shared  decision-making using stroke and treatment benefits and risks, lifestyle and patient preferences to determine type of antithrombotic therapy.   Notes:     Task: Minimize Stroke Risk Completed 02/13/2021  Due Date: 04/18/2021  Note:   Care Management Activities:    - ability to self-check pulse rate and rhythm assessed - barriers to treatment reviewed and addressed - fall and injury prevention strategies promoted - importance of rapid response to stroke symptoms reviewed - stroke risk screen reviewed - stroke signs and symptoms reviewed    Notes:     Care Plan : RN Care Manager Plan of Care  Updates made by Michiel Cowboy, RN since 02/13/2021 12:00 AM     Problem: Knowledge Deficit Related to Atrial Fibrillation   Priority: High     Long-Range Goal: Development of Plan of Care for the Management of Atrial Fibrillation   Start Date: 02/13/2021  Expected End Date: 02/18/2021  Priority: High  Note:   Current Barriers:  Chronic Disease Management support and education needs related to Atrial Fibrillation   RNCM Clinical Goal(s):  Patient will demonstrate Ongoing health management independence as evidenced by continuation of monitoring pulse and B/P daily; taking aspirin daily; contacting PCP as needed for questions or concerns continue to work with RN Care Manager to address care management and care coordination needs related  to  Atrial Fibrillation as evidenced by adherence to CM Team Scheduled appointments through collaboration with RN Care manager, provider, and care team.   Interventions: Inter-disciplinary care team collaboration (see longitudinal plan of care) Evaluation of current treatment plan related to  self management and patient's adherence to plan as established by provider   AFIB Interventions: (Status:  Goal on track:  Yes.) Long Term Goal   Counseled on increased risk of stroke due to Afib and benefits of anticoagulation for stroke prevention Counseled on bleeding  risk associated with daily aspirin and importance of self-monitoring for signs/symptoms of bleeding Counseled on avoidance of NSAIDs due to increased bleeding risk with anticoagulants Screening for signs and symptoms of depression related to chronic disease state  Previously sent atrial fibrillation [facility] educational booklet Discussed taking pulse and B/P daily and recording the values Discussed to take pulse and B/P when patient feels that her heart is racing/ flutter, record the values, notify PCP for high pulse rates   Patient Goals/Self-Care Activities: Take all medications as prescribed Attend all scheduled provider appointments Call provider office for new concerns or questions  check pulse (heart) rate once a day make a plan to exercise regularly make a plan to eat healthy take medicine as prescribed Eat a low cholesterol, heart healthy diet Continue to take pulse and B/P daily and record the values Take your pulse when you feel your heart racing/fluttering; record the value; contact PCP for high pulse rates especially if 120 or greater Contact PCP for frequent events of your heart racing/fluttering  Follow Up Plan:  Telephone follow up appointment with care management team member scheduled for:  April       Plan: Telephone follow up appointment with care management team member scheduled for:  April. Nurse will send PCP today's assessment note.  Emelia Loron RN, [place] [phone] [email or web address]

## 2021-02-14 ENCOUNTER — Telehealth: Payer: Self-pay

## 2021-02-14 NOTE — Telephone Encounter (Signed)
° °  Telephone encounter was:  Successful.  02/14/2021 Name: Jamaira Sherk MRN: [id number] DOB: 04-01-1945  Madelynn Malson is a 76 y.o. year old female who is a primary care patient of Hasanaj, Samul Dada, MD . The community resource team was consulted for assistance with [place] guide performed the following interventions: Patient provided with information about care guide support team and interviewed to confirm resource needs.Patient requested I mail her Care Giver support resources   Follow Up Plan:  Care guide will follow up with patient by phone over the next week    [place], Care Management  [phone] [address], [place], [place] [address] Phone: [phone] Email: [email or web address]

## 2021-02-18 ENCOUNTER — Telehealth: Payer: Self-pay

## 2021-02-18 NOTE — Telephone Encounter (Signed)
° °  Telephone encounter was:  Successful.  02/18/2021 Name: Monzerat Handler MRN: [id number] DOB: 08-08-1945  Doneshia Hill is a 76 y.o. year old female who is a primary care patient of Hasanaj, Samul Dada, MD . The community resource team was consulted for assistance with [place] guide performed the following interventions: Follow up call Follow Up Plan:  Care guide will follow up with patient by phone over the next two days    [place], Care Management  [phone] [address], [place], [place] [address] Phone: [phone] Email: [email or web address]

## 2021-03-21 DIAGNOSIS — J329 Chronic sinusitis, unspecified: Secondary | ICD-10-CM | POA: Diagnosis not present

## 2021-04-09 DIAGNOSIS — J4520 Mild intermittent asthma, uncomplicated: Secondary | ICD-10-CM | POA: Diagnosis not present

## 2021-05-03 DIAGNOSIS — Z79899 Other long term (current) drug therapy: Secondary | ICD-10-CM | POA: Diagnosis not present

## 2021-05-03 DIAGNOSIS — W19XXXA Unspecified fall, initial encounter: Secondary | ICD-10-CM | POA: Diagnosis not present

## 2021-05-03 DIAGNOSIS — S0101XA Laceration without foreign body of scalp, initial encounter: Secondary | ICD-10-CM | POA: Diagnosis not present

## 2021-05-03 DIAGNOSIS — R58 Hemorrhage, not elsewhere classified: Secondary | ICD-10-CM | POA: Diagnosis not present

## 2021-05-03 DIAGNOSIS — I672 Cerebral atherosclerosis: Secondary | ICD-10-CM | POA: Diagnosis not present

## 2021-05-03 DIAGNOSIS — M25551 Pain in right hip: Secondary | ICD-10-CM | POA: Diagnosis not present

## 2021-05-03 DIAGNOSIS — I4891 Unspecified atrial fibrillation: Secondary | ICD-10-CM | POA: Diagnosis not present

## 2021-05-03 DIAGNOSIS — Z743 Need for continuous supervision: Secondary | ICD-10-CM | POA: Diagnosis not present

## 2021-05-03 DIAGNOSIS — Z87891 Personal history of nicotine dependence: Secondary | ICD-10-CM | POA: Diagnosis not present

## 2021-05-03 DIAGNOSIS — M797 Fibromyalgia: Secondary | ICD-10-CM | POA: Diagnosis not present

## 2021-05-03 DIAGNOSIS — I10 Essential (primary) hypertension: Secondary | ICD-10-CM | POA: Diagnosis not present

## 2021-05-03 DIAGNOSIS — M545 Low back pain, unspecified: Secondary | ICD-10-CM | POA: Diagnosis not present

## 2021-05-03 DIAGNOSIS — S0191XA Laceration without foreign body of unspecified part of head, initial encounter: Secondary | ICD-10-CM | POA: Diagnosis not present

## 2021-05-03 DIAGNOSIS — M47816 Spondylosis without myelopathy or radiculopathy, lumbar region: Secondary | ICD-10-CM | POA: Diagnosis not present

## 2021-05-03 DIAGNOSIS — M160 Bilateral primary osteoarthritis of hip: Secondary | ICD-10-CM | POA: Diagnosis not present

## 2021-05-03 DIAGNOSIS — M4316 Spondylolisthesis, lumbar region: Secondary | ICD-10-CM | POA: Diagnosis not present

## 2021-05-03 DIAGNOSIS — S0990XA Unspecified injury of head, initial encounter: Secondary | ICD-10-CM | POA: Diagnosis not present

## 2021-05-06 ENCOUNTER — Other Ambulatory Visit: Payer: Self-pay | Admitting: *Deleted

## 2021-05-06 NOTE — Patient Outreach (Signed)
[facility]) Care Management ? ?05/06/2021 ? ?Donell Beers ?03-Aug-1945 ?[id number] ? ?Unsuccessful outreach attempt made to patient. Patient answered the phone and stated that she would not be able to speak today. Patient did request that this nurse call back at a later date.  ? ?Plan: ?RN Health Coach will call patient within the month of May. ? ?Emelia Loron RN, BSN ?[facility] Care Management  ?RN Health Coach ?[phone] ?[email or web address] ? ? ?

## 2021-05-09 ENCOUNTER — Ambulatory Visit: Payer: Self-pay | Admitting: *Deleted

## 2021-05-10 DIAGNOSIS — Z4802 Encounter for removal of sutures: Secondary | ICD-10-CM | POA: Diagnosis not present

## 2021-05-10 DIAGNOSIS — S0181XD Laceration without foreign body of other part of head, subsequent encounter: Secondary | ICD-10-CM | POA: Diagnosis not present

## 2021-05-20 ENCOUNTER — Other Ambulatory Visit: Payer: Self-pay | Admitting: *Deleted

## 2021-05-20 NOTE — Patient Outreach (Signed)
[facility]) Care Management ? ?05/20/2021 ? ?Donell Beers ?08-13-1945 ?[id number] ? ?Unsuccessful outreach attempt made to patient. RN Health Coach left HIPAA compliant voicemail message along with her contact information. ? ?Plan: ?RN Health Coach will call patient within the month of June. ? ?Emelia Loron RN, BSN ?[facility] Care Management  ?RN Health Coach ?[phone] ?[email or web address] ? ? ?

## 2021-06-07 DIAGNOSIS — S42211A Unspecified displaced fracture of surgical neck of right humerus, initial encounter for closed fracture: Secondary | ICD-10-CM | POA: Diagnosis not present

## 2021-06-07 DIAGNOSIS — M25511 Pain in right shoulder: Secondary | ICD-10-CM | POA: Diagnosis not present

## 2021-06-07 DIAGNOSIS — Z01818 Encounter for other preprocedural examination: Secondary | ICD-10-CM | POA: Diagnosis not present

## 2021-06-07 DIAGNOSIS — S42222A 2-part displaced fracture of surgical neck of left humerus, initial encounter for closed fracture: Secondary | ICD-10-CM | POA: Diagnosis not present

## 2021-06-07 DIAGNOSIS — S42211D Unspecified displaced fracture of surgical neck of right humerus, subsequent encounter for fracture with routine healing: Secondary | ICD-10-CM | POA: Diagnosis not present

## 2021-06-07 DIAGNOSIS — S42294D Other nondisplaced fracture of upper end of right humerus, subsequent encounter for fracture with routine healing: Secondary | ICD-10-CM | POA: Diagnosis not present

## 2021-06-07 DIAGNOSIS — R52 Pain, unspecified: Secondary | ICD-10-CM | POA: Diagnosis not present

## 2021-06-07 DIAGNOSIS — S42222D 2-part displaced fracture of surgical neck of left humerus, subsequent encounter for fracture with routine healing: Secondary | ICD-10-CM | POA: Diagnosis not present

## 2021-06-07 DIAGNOSIS — X58XXXD Exposure to other specified factors, subsequent encounter: Secondary | ICD-10-CM | POA: Diagnosis not present

## 2021-06-07 DIAGNOSIS — G8929 Other chronic pain: Secondary | ICD-10-CM | POA: Diagnosis not present

## 2021-06-14 DIAGNOSIS — M19011 Primary osteoarthritis, right shoulder: Secondary | ICD-10-CM | POA: Diagnosis not present

## 2021-06-14 DIAGNOSIS — S42291D Other displaced fracture of upper end of right humerus, subsequent encounter for fracture with routine healing: Secondary | ICD-10-CM | POA: Diagnosis not present

## 2021-06-14 DIAGNOSIS — M19111 Post-traumatic osteoarthritis, right shoulder: Secondary | ICD-10-CM | POA: Diagnosis not present

## 2021-06-14 DIAGNOSIS — M8448XA Pathological fracture, other site, initial encounter for fracture: Secondary | ICD-10-CM | POA: Diagnosis not present

## 2021-06-20 DIAGNOSIS — R55 Syncope and collapse: Secondary | ICD-10-CM | POA: Diagnosis not present

## 2021-06-20 DIAGNOSIS — H814 Vertigo of central origin: Secondary | ICD-10-CM | POA: Diagnosis not present

## 2021-07-03 ENCOUNTER — Other Ambulatory Visit: Payer: Self-pay | Admitting: *Deleted

## 2021-07-03 NOTE — Patient Outreach (Signed)
[facility]) Care Management  07/03/2021  Ritika Hellickson 1945/08/22 [id number]  Unsuccessful outreach attempt made to patient. RN Health Coach left HIPAA compliant voicemail message along with her contact information.  Plan: RN Health Coach will call patient within the month of July and will send an unsuccessful letter.   Emelia Loron RN, BSN [place] [phone] [email or web address]

## 2021-07-11 DIAGNOSIS — I2510 Atherosclerotic heart disease of native coronary artery without angina pectoris: Secondary | ICD-10-CM | POA: Diagnosis not present

## 2021-07-11 DIAGNOSIS — R296 Repeated falls: Secondary | ICD-10-CM | POA: Diagnosis not present

## 2021-07-11 DIAGNOSIS — T457X1A Poisoning by anticoagulant antagonists, vitamin K and other coagulants, accidental (unintentional), initial encounter: Secondary | ICD-10-CM | POA: Diagnosis not present

## 2021-07-11 DIAGNOSIS — D689 Coagulation defect, unspecified: Secondary | ICD-10-CM | POA: Diagnosis not present

## 2021-07-11 DIAGNOSIS — Z9189 Other specified personal risk factors, not elsewhere classified: Secondary | ICD-10-CM | POA: Insufficient documentation

## 2021-07-11 DIAGNOSIS — I952 Hypotension due to drugs: Secondary | ICD-10-CM | POA: Diagnosis not present

## 2021-07-11 DIAGNOSIS — I480 Paroxysmal atrial fibrillation: Secondary | ICD-10-CM | POA: Diagnosis not present

## 2021-07-26 DIAGNOSIS — I952 Hypotension due to drugs: Secondary | ICD-10-CM | POA: Diagnosis not present

## 2021-07-26 DIAGNOSIS — I480 Paroxysmal atrial fibrillation: Secondary | ICD-10-CM | POA: Diagnosis not present

## 2021-07-26 DIAGNOSIS — I2510 Atherosclerotic heart disease of native coronary artery without angina pectoris: Secondary | ICD-10-CM | POA: Diagnosis not present

## 2021-07-26 DIAGNOSIS — Z01810 Encounter for preprocedural cardiovascular examination: Secondary | ICD-10-CM | POA: Diagnosis not present

## 2021-07-26 DIAGNOSIS — R55 Syncope and collapse: Secondary | ICD-10-CM | POA: Diagnosis not present

## 2021-07-26 DIAGNOSIS — I5030 Unspecified diastolic (congestive) heart failure: Secondary | ICD-10-CM | POA: Diagnosis not present

## 2021-07-26 DIAGNOSIS — I071 Rheumatic tricuspid insufficiency: Secondary | ICD-10-CM | POA: Diagnosis not present

## 2021-08-08 ENCOUNTER — Other Ambulatory Visit: Payer: Self-pay | Admitting: *Deleted

## 2021-08-08 NOTE — Patient Outreach (Signed)
[facility]) Care Management  08/08/2021  Shannin Naab 1945-07-28 [id number]  Multiple attempts to establish contact with patient without success. No response from letter mailed to patient. Case is being closed at this time.   Plan: RN Health Coach will close case.   Emelia Loron RN, BSN [place] [phone] [email or web address]

## 2021-09-06 DIAGNOSIS — R52 Pain, unspecified: Secondary | ICD-10-CM | POA: Diagnosis not present

## 2021-09-06 DIAGNOSIS — Z01818 Encounter for other preprocedural examination: Secondary | ICD-10-CM | POA: Diagnosis not present

## 2021-09-06 DIAGNOSIS — M2500 Hemarthrosis, unspecified joint: Secondary | ICD-10-CM | POA: Diagnosis not present

## 2021-09-06 DIAGNOSIS — M25012 Hemarthrosis, left shoulder: Secondary | ICD-10-CM | POA: Diagnosis not present

## 2021-09-24 DIAGNOSIS — I2510 Atherosclerotic heart disease of native coronary artery without angina pectoris: Secondary | ICD-10-CM | POA: Diagnosis not present

## 2021-09-24 DIAGNOSIS — M19111 Post-traumatic osteoarthritis, right shoulder: Secondary | ICD-10-CM | POA: Diagnosis not present

## 2021-09-24 DIAGNOSIS — I480 Paroxysmal atrial fibrillation: Secondary | ICD-10-CM | POA: Diagnosis not present

## 2021-09-24 DIAGNOSIS — D649 Anemia, unspecified: Secondary | ICD-10-CM | POA: Diagnosis not present

## 2021-09-24 DIAGNOSIS — M12511 Traumatic arthropathy, right shoulder: Secondary | ICD-10-CM | POA: Diagnosis not present

## 2021-09-24 DIAGNOSIS — E785 Hyperlipidemia, unspecified: Secondary | ICD-10-CM | POA: Diagnosis not present

## 2021-09-24 DIAGNOSIS — S42301D Unspecified fracture of shaft of humerus, right arm, subsequent encounter for fracture with routine healing: Secondary | ICD-10-CM | POA: Diagnosis not present

## 2021-09-24 DIAGNOSIS — Z791 Long term (current) use of non-steroidal anti-inflammatories (NSAID): Secondary | ICD-10-CM | POA: Diagnosis not present

## 2021-09-24 DIAGNOSIS — Z9181 History of falling: Secondary | ICD-10-CM | POA: Diagnosis not present

## 2021-09-24 DIAGNOSIS — S42201P Unspecified fracture of upper end of right humerus, subsequent encounter for fracture with malunion: Secondary | ICD-10-CM | POA: Diagnosis not present

## 2021-09-24 DIAGNOSIS — M1990 Unspecified osteoarthritis, unspecified site: Secondary | ICD-10-CM | POA: Diagnosis not present

## 2021-09-24 DIAGNOSIS — S42231A 3-part fracture of surgical neck of right humerus, initial encounter for closed fracture: Secondary | ICD-10-CM | POA: Diagnosis not present

## 2021-09-24 DIAGNOSIS — F32A Depression, unspecified: Secondary | ICD-10-CM | POA: Diagnosis not present

## 2021-09-24 DIAGNOSIS — M6281 Muscle weakness (generalized): Secondary | ICD-10-CM | POA: Diagnosis not present

## 2021-09-24 DIAGNOSIS — E7800 Pure hypercholesterolemia, unspecified: Secondary | ICD-10-CM | POA: Diagnosis not present

## 2021-09-24 DIAGNOSIS — Z96611 Presence of right artificial shoulder joint: Secondary | ICD-10-CM | POA: Diagnosis not present

## 2021-09-24 DIAGNOSIS — I10 Essential (primary) hypertension: Secondary | ICD-10-CM | POA: Diagnosis not present

## 2021-09-24 DIAGNOSIS — M170 Bilateral primary osteoarthritis of knee: Secondary | ICD-10-CM | POA: Diagnosis not present

## 2021-09-24 DIAGNOSIS — G629 Polyneuropathy, unspecified: Secondary | ICD-10-CM | POA: Diagnosis not present

## 2021-09-24 DIAGNOSIS — M25511 Pain in right shoulder: Secondary | ICD-10-CM | POA: Diagnosis not present

## 2021-09-24 DIAGNOSIS — S42201A Unspecified fracture of upper end of right humerus, initial encounter for closed fracture: Secondary | ICD-10-CM | POA: Diagnosis not present

## 2021-09-24 DIAGNOSIS — Z4731 Aftercare following explantation of shoulder joint prosthesis: Secondary | ICD-10-CM | POA: Diagnosis not present

## 2021-09-24 DIAGNOSIS — Z96653 Presence of artificial knee joint, bilateral: Secondary | ICD-10-CM | POA: Diagnosis not present

## 2021-09-24 DIAGNOSIS — M797 Fibromyalgia: Secondary | ICD-10-CM | POA: Diagnosis not present

## 2021-09-24 DIAGNOSIS — E876 Hypokalemia: Secondary | ICD-10-CM | POA: Diagnosis not present

## 2021-09-24 DIAGNOSIS — Z79899 Other long term (current) drug therapy: Secondary | ICD-10-CM | POA: Diagnosis not present

## 2021-09-24 DIAGNOSIS — G8929 Other chronic pain: Secondary | ICD-10-CM | POA: Diagnosis not present

## 2021-09-24 DIAGNOSIS — Z79891 Long term (current) use of opiate analgesic: Secondary | ICD-10-CM | POA: Diagnosis not present

## 2021-09-24 DIAGNOSIS — S42251A Displaced fracture of greater tuberosity of right humerus, initial encounter for closed fracture: Secondary | ICD-10-CM | POA: Diagnosis not present

## 2021-09-24 DIAGNOSIS — R2689 Other abnormalities of gait and mobility: Secondary | ICD-10-CM | POA: Diagnosis not present

## 2021-09-24 DIAGNOSIS — G8918 Other acute postprocedural pain: Secondary | ICD-10-CM | POA: Diagnosis not present

## 2021-09-24 DIAGNOSIS — X58XXXD Exposure to other specified factors, subsequent encounter: Secondary | ICD-10-CM | POA: Diagnosis not present

## 2021-09-24 DIAGNOSIS — Z7982 Long term (current) use of aspirin: Secondary | ICD-10-CM | POA: Diagnosis not present

## 2021-09-24 DIAGNOSIS — Z471 Aftercare following joint replacement surgery: Secondary | ICD-10-CM | POA: Diagnosis not present

## 2021-09-25 DIAGNOSIS — Z96611 Presence of right artificial shoulder joint: Secondary | ICD-10-CM | POA: Diagnosis not present

## 2021-09-25 DIAGNOSIS — Z471 Aftercare following joint replacement surgery: Secondary | ICD-10-CM | POA: Diagnosis not present

## 2021-09-26 DIAGNOSIS — Z471 Aftercare following joint replacement surgery: Secondary | ICD-10-CM | POA: Diagnosis not present

## 2021-09-26 DIAGNOSIS — Z96611 Presence of right artificial shoulder joint: Secondary | ICD-10-CM | POA: Diagnosis not present

## 2021-09-30 DIAGNOSIS — G629 Polyneuropathy, unspecified: Secondary | ICD-10-CM | POA: Diagnosis not present

## 2021-09-30 DIAGNOSIS — R2689 Other abnormalities of gait and mobility: Secondary | ICD-10-CM | POA: Diagnosis not present

## 2021-09-30 DIAGNOSIS — I10 Essential (primary) hypertension: Secondary | ICD-10-CM | POA: Diagnosis not present

## 2021-09-30 DIAGNOSIS — M6281 Muscle weakness (generalized): Secondary | ICD-10-CM | POA: Diagnosis not present

## 2021-09-30 DIAGNOSIS — M797 Fibromyalgia: Secondary | ICD-10-CM | POA: Diagnosis not present

## 2021-09-30 DIAGNOSIS — I480 Paroxysmal atrial fibrillation: Secondary | ICD-10-CM | POA: Diagnosis not present

## 2021-09-30 DIAGNOSIS — M1990 Unspecified osteoarthritis, unspecified site: Secondary | ICD-10-CM | POA: Diagnosis not present

## 2021-09-30 DIAGNOSIS — M25511 Pain in right shoulder: Secondary | ICD-10-CM | POA: Diagnosis not present

## 2021-09-30 DIAGNOSIS — Z4731 Aftercare following explantation of shoulder joint prosthesis: Secondary | ICD-10-CM | POA: Diagnosis not present

## 2021-09-30 DIAGNOSIS — Z9181 History of falling: Secondary | ICD-10-CM | POA: Diagnosis not present

## 2021-10-07 DIAGNOSIS — M25511 Pain in right shoulder: Secondary | ICD-10-CM | POA: Diagnosis not present

## 2021-10-17 DIAGNOSIS — Z9181 History of falling: Secondary | ICD-10-CM | POA: Diagnosis not present

## 2021-10-17 DIAGNOSIS — M6281 Muscle weakness (generalized): Secondary | ICD-10-CM | POA: Diagnosis not present

## 2021-10-17 DIAGNOSIS — Z4731 Aftercare following explantation of shoulder joint prosthesis: Secondary | ICD-10-CM | POA: Diagnosis not present

## 2021-10-21 DIAGNOSIS — F32A Depression, unspecified: Secondary | ICD-10-CM | POA: Diagnosis not present

## 2021-10-21 DIAGNOSIS — E7800 Pure hypercholesterolemia, unspecified: Secondary | ICD-10-CM | POA: Diagnosis not present

## 2021-10-21 DIAGNOSIS — E7849 Other hyperlipidemia: Secondary | ICD-10-CM | POA: Diagnosis not present

## 2021-10-21 DIAGNOSIS — D6832 Hemorrhagic disorder due to extrinsic circulating anticoagulants: Secondary | ICD-10-CM | POA: Diagnosis not present

## 2021-10-21 DIAGNOSIS — T148XXA Other injury of unspecified body region, initial encounter: Secondary | ICD-10-CM | POA: Insufficient documentation

## 2021-10-21 DIAGNOSIS — I10 Essential (primary) hypertension: Secondary | ICD-10-CM | POA: Insufficient documentation

## 2021-10-21 DIAGNOSIS — G894 Chronic pain syndrome: Secondary | ICD-10-CM | POA: Diagnosis not present

## 2021-10-21 DIAGNOSIS — R54 Age-related physical debility: Secondary | ICD-10-CM | POA: Diagnosis not present

## 2021-10-21 DIAGNOSIS — L02413 Cutaneous abscess of right upper limb: Secondary | ICD-10-CM | POA: Diagnosis not present

## 2021-10-21 DIAGNOSIS — Z7901 Long term (current) use of anticoagulants: Secondary | ICD-10-CM | POA: Diagnosis not present

## 2021-10-21 DIAGNOSIS — M25511 Pain in right shoulder: Secondary | ICD-10-CM | POA: Diagnosis not present

## 2021-10-21 DIAGNOSIS — Z4731 Aftercare following explantation of shoulder joint prosthesis: Secondary | ICD-10-CM | POA: Diagnosis not present

## 2021-10-21 DIAGNOSIS — E785 Hyperlipidemia, unspecified: Secondary | ICD-10-CM | POA: Diagnosis not present

## 2021-10-21 DIAGNOSIS — M6281 Muscle weakness (generalized): Secondary | ICD-10-CM | POA: Diagnosis not present

## 2021-10-21 DIAGNOSIS — Z792 Long term (current) use of antibiotics: Secondary | ICD-10-CM | POA: Diagnosis not present

## 2021-10-21 DIAGNOSIS — Z1152 Encounter for screening for COVID-19: Secondary | ICD-10-CM | POA: Diagnosis not present

## 2021-10-21 DIAGNOSIS — T8450XA Infection and inflammatory reaction due to unspecified internal joint prosthesis, initial encounter: Secondary | ICD-10-CM | POA: Diagnosis not present

## 2021-10-21 DIAGNOSIS — R42 Dizziness and giddiness: Secondary | ICD-10-CM | POA: Diagnosis not present

## 2021-10-21 DIAGNOSIS — T8459XD Infection and inflammatory reaction due to other internal joint prosthesis, subsequent encounter: Secondary | ICD-10-CM | POA: Diagnosis not present

## 2021-10-21 DIAGNOSIS — Z7982 Long term (current) use of aspirin: Secondary | ICD-10-CM | POA: Diagnosis not present

## 2021-10-21 DIAGNOSIS — T8131XA Disruption of external operation (surgical) wound, not elsewhere classified, initial encounter: Secondary | ICD-10-CM | POA: Diagnosis not present

## 2021-10-21 DIAGNOSIS — T8189XA Other complications of procedures, not elsewhere classified, initial encounter: Secondary | ICD-10-CM | POA: Diagnosis not present

## 2021-10-21 DIAGNOSIS — Z471 Aftercare following joint replacement surgery: Secondary | ICD-10-CM | POA: Diagnosis not present

## 2021-10-21 DIAGNOSIS — Z96611 Presence of right artificial shoulder joint: Secondary | ICD-10-CM | POA: Insufficient documentation

## 2021-10-21 DIAGNOSIS — I2510 Atherosclerotic heart disease of native coronary artery without angina pectoris: Secondary | ICD-10-CM | POA: Diagnosis not present

## 2021-10-21 DIAGNOSIS — G8918 Other acute postprocedural pain: Secondary | ICD-10-CM | POA: Diagnosis not present

## 2021-10-21 DIAGNOSIS — I480 Paroxysmal atrial fibrillation: Secondary | ICD-10-CM | POA: Diagnosis not present

## 2021-10-21 DIAGNOSIS — M797 Fibromyalgia: Secondary | ICD-10-CM | POA: Diagnosis not present

## 2021-10-21 DIAGNOSIS — D649 Anemia, unspecified: Secondary | ICD-10-CM | POA: Diagnosis not present

## 2021-10-21 DIAGNOSIS — T847XXA Infection and inflammatory reaction due to other internal orthopedic prosthetic devices, implants and grafts, initial encounter: Secondary | ICD-10-CM | POA: Diagnosis not present

## 2021-10-21 DIAGNOSIS — M170 Bilateral primary osteoarthritis of knee: Secondary | ICD-10-CM | POA: Diagnosis not present

## 2021-10-21 DIAGNOSIS — B9689 Other specified bacterial agents as the cause of diseases classified elsewhere: Secondary | ICD-10-CM | POA: Diagnosis not present

## 2021-10-21 DIAGNOSIS — S42201D Unspecified fracture of upper end of right humerus, subsequent encounter for fracture with routine healing: Secondary | ICD-10-CM | POA: Diagnosis not present

## 2021-10-21 DIAGNOSIS — T8189XD Other complications of procedures, not elsewhere classified, subsequent encounter: Secondary | ICD-10-CM | POA: Diagnosis not present

## 2021-10-21 DIAGNOSIS — R296 Repeated falls: Secondary | ICD-10-CM | POA: Diagnosis not present

## 2021-10-21 DIAGNOSIS — D62 Acute posthemorrhagic anemia: Secondary | ICD-10-CM | POA: Diagnosis not present

## 2021-10-21 DIAGNOSIS — I4891 Unspecified atrial fibrillation: Secondary | ICD-10-CM | POA: Diagnosis not present

## 2021-10-21 DIAGNOSIS — K922 Gastrointestinal hemorrhage, unspecified: Secondary | ICD-10-CM | POA: Diagnosis not present

## 2021-10-21 DIAGNOSIS — Z79891 Long term (current) use of opiate analgesic: Secondary | ICD-10-CM | POA: Diagnosis not present

## 2021-10-21 DIAGNOSIS — D638 Anemia in other chronic diseases classified elsewhere: Secondary | ICD-10-CM | POA: Diagnosis not present

## 2021-10-21 DIAGNOSIS — R2689 Other abnormalities of gait and mobility: Secondary | ICD-10-CM | POA: Diagnosis not present

## 2021-10-21 DIAGNOSIS — R531 Weakness: Secondary | ICD-10-CM | POA: Diagnosis not present

## 2021-10-21 DIAGNOSIS — Z79899 Other long term (current) drug therapy: Secondary | ICD-10-CM | POA: Diagnosis not present

## 2021-10-21 DIAGNOSIS — E876 Hypokalemia: Secondary | ICD-10-CM | POA: Diagnosis not present

## 2021-10-21 DIAGNOSIS — Z01818 Encounter for other preprocedural examination: Secondary | ICD-10-CM | POA: Diagnosis not present

## 2021-10-21 DIAGNOSIS — I110 Hypertensive heart disease with heart failure: Secondary | ICD-10-CM | POA: Diagnosis not present

## 2021-10-21 DIAGNOSIS — M1990 Unspecified osteoarthritis, unspecified site: Secondary | ICD-10-CM | POA: Diagnosis not present

## 2021-10-21 DIAGNOSIS — T8459XA Infection and inflammatory reaction due to other internal joint prosthesis, initial encounter: Secondary | ICD-10-CM | POA: Diagnosis not present

## 2021-10-21 DIAGNOSIS — Z7984 Long term (current) use of oral hypoglycemic drugs: Secondary | ICD-10-CM | POA: Diagnosis not present

## 2021-10-21 DIAGNOSIS — D6489 Other specified anemias: Secondary | ICD-10-CM | POA: Diagnosis not present

## 2021-10-21 DIAGNOSIS — R410 Disorientation, unspecified: Secondary | ICD-10-CM | POA: Diagnosis not present

## 2021-10-21 DIAGNOSIS — L24A9 Irritant contact dermatitis due friction or contact with other specified body fluids: Secondary | ICD-10-CM | POA: Insufficient documentation

## 2021-10-29 DIAGNOSIS — D649 Anemia, unspecified: Secondary | ICD-10-CM | POA: Insufficient documentation

## 2021-11-11 DIAGNOSIS — A498 Other bacterial infections of unspecified site: Secondary | ICD-10-CM | POA: Diagnosis not present

## 2021-11-11 DIAGNOSIS — R2689 Other abnormalities of gait and mobility: Secondary | ICD-10-CM | POA: Diagnosis not present

## 2021-11-11 DIAGNOSIS — M25511 Pain in right shoulder: Secondary | ICD-10-CM | POA: Diagnosis not present

## 2021-11-11 DIAGNOSIS — Z96611 Presence of right artificial shoulder joint: Secondary | ICD-10-CM | POA: Diagnosis not present

## 2021-11-11 DIAGNOSIS — I2510 Atherosclerotic heart disease of native coronary artery without angina pectoris: Secondary | ICD-10-CM | POA: Diagnosis not present

## 2021-11-11 DIAGNOSIS — E7849 Other hyperlipidemia: Secondary | ICD-10-CM | POA: Diagnosis not present

## 2021-11-11 DIAGNOSIS — I10 Essential (primary) hypertension: Secondary | ICD-10-CM | POA: Diagnosis not present

## 2021-11-11 DIAGNOSIS — M797 Fibromyalgia: Secondary | ICD-10-CM | POA: Diagnosis not present

## 2021-11-11 DIAGNOSIS — R296 Repeated falls: Secondary | ICD-10-CM | POA: Diagnosis not present

## 2021-11-11 DIAGNOSIS — Z96619 Presence of unspecified artificial shoulder joint: Secondary | ICD-10-CM | POA: Diagnosis not present

## 2021-11-11 DIAGNOSIS — M6281 Muscle weakness (generalized): Secondary | ICD-10-CM | POA: Diagnosis not present

## 2021-11-11 DIAGNOSIS — M1990 Unspecified osteoarthritis, unspecified site: Secondary | ICD-10-CM | POA: Diagnosis not present

## 2021-11-11 DIAGNOSIS — Z7185 Encounter for immunization safety counseling: Secondary | ICD-10-CM | POA: Diagnosis not present

## 2021-11-11 DIAGNOSIS — T8459XD Infection and inflammatory reaction due to other internal joint prosthesis, subsequent encounter: Secondary | ICD-10-CM | POA: Diagnosis not present

## 2021-11-11 DIAGNOSIS — T8459XA Infection and inflammatory reaction due to other internal joint prosthesis, initial encounter: Secondary | ICD-10-CM | POA: Diagnosis not present

## 2021-11-11 DIAGNOSIS — G894 Chronic pain syndrome: Secondary | ICD-10-CM | POA: Diagnosis not present

## 2021-11-11 DIAGNOSIS — B948 Sequelae of other specified infectious and parasitic diseases: Secondary | ICD-10-CM | POA: Diagnosis not present

## 2021-11-14 DIAGNOSIS — Z96611 Presence of right artificial shoulder joint: Secondary | ICD-10-CM | POA: Diagnosis not present

## 2021-11-19 ENCOUNTER — Other Ambulatory Visit: Payer: Self-pay

## 2021-11-19 ENCOUNTER — Ambulatory Visit (INDEPENDENT_AMBULATORY_CARE_PROVIDER_SITE_OTHER): Payer: Medicare Other | Admitting: Infectious Disease

## 2021-11-19 ENCOUNTER — Telehealth: Payer: Self-pay

## 2021-11-19 ENCOUNTER — Encounter: Payer: Self-pay | Admitting: Infectious Disease

## 2021-11-19 VITALS — BP 129/84 | HR 78 | Temp 98.1°F | Ht 61.0 in | Wt 179.0 lb

## 2021-11-19 DIAGNOSIS — A498 Other bacterial infections of unspecified site: Secondary | ICD-10-CM

## 2021-11-19 DIAGNOSIS — Z7185 Encounter for immunization safety counseling: Secondary | ICD-10-CM

## 2021-11-19 DIAGNOSIS — Z96611 Presence of right artificial shoulder joint: Secondary | ICD-10-CM

## 2021-11-19 DIAGNOSIS — B948 Sequelae of other specified infectious and parasitic diseases: Secondary | ICD-10-CM

## 2021-11-19 DIAGNOSIS — T8459XA Infection and inflammatory reaction due to other internal joint prosthesis, initial encounter: Secondary | ICD-10-CM | POA: Diagnosis not present

## 2021-11-19 DIAGNOSIS — Z96619 Presence of unspecified artificial shoulder joint: Secondary | ICD-10-CM | POA: Diagnosis not present

## 2021-11-19 HISTORY — DX: Encounter for immunization safety counseling: Z71.85

## 2021-11-19 HISTORY — DX: Other bacterial infections of unspecified site: A49.8

## 2021-11-19 HISTORY — DX: Presence of unspecified artificial shoulder joint: T84.59XA

## 2021-11-19 HISTORY — DX: Sequelae of other specified infectious and parasitic diseases: B94.8

## 2021-11-19 NOTE — Telephone Encounter (Signed)
Received call in triage from facility requesting labs be faxed to them as they are managing vanc dose.  Fax to [phone]  Eugenia Mcalpine, LPN

## 2021-11-19 NOTE — Progress Notes (Signed)
Subjective:  Reason for infectious disease consult: Infected prosthetic shoulder  Requesting physician: Lamar Benes, MD    Patient ID: Mary Weeks, female    DOB: [birth year], 76 y.o.   MRN: [id number]  HPI  Mary Weeks is a very lovely 76 year old Caucasian woman with a past medical history significant for coronary artery disease atrial fibrillation who sustained a fracture in her arm with proximal humerus malunion.  She underwent reverse shoulder arthroplasty for this proximal humerus malunion in September of this year.  This is performed by Dr. Dwain Sarna at [facility].  Unfortunately she developed ongoing drainage and had serial washouts and exchange of the modular components performed on October 24, 2021 with repeat washouts on the October 27, 2021 and October 29, 2021.  Intraoperative cultures yielded Morganella species that was fairly resistant to most antibiotics though in terms of oral antibiotics it appears to be sensitive to fluoroquinolones and Bactrim.  Corynebacterium stratum was also isolated.  She was placed on vancomycin 1 g daily as well as ertapenem 1 g daily.  In looking through the records at the skilled nursing facility where she resides at [facility] I do not see any evidence of labs having been done.  It is imperative that while she is on vancomycin that her vancomycin levels are checked twice weekly and a BMP with GFR is tract biweekly ensure we do not run into nephrotoxicity.  She says her shoulder pain is improved compared to several weeks ago.  She seems in reasonable spirits.    Past Medical History:  Diagnosis Date   Fibromyalgia    Hypertension     Past Surgical History:  Procedure Laterality Date   CARDIAC CATHETERIZATION N/A 12/27/2014   Procedure: Left Heart Cath and Coronary Angiography;  Surgeon: Dixie Dials, MD;  Location: [place] [facility];  Service: Cardiovascular;  Laterality:  N/A;    No family history on file.    Social History   Socioeconomic History   Marital status: Widowed    Spouse name: Not on file   Number of children: 1   Years of education: Not on file   Highest education level: 12th grade  Occupational History   Occupation: Retired  Tobacco Use   Smoking status: Never   Smokeless tobacco: Never  Vaping Use   Vaping Use: Never used  Substance and Sexual Activity   Alcohol use: No   Drug use: Never   Sexual activity: Not Currently  Other Topics Concern   Not on file  Social History Narrative   Not on file   Social Determinants of Health   Financial Resource Strain: Not on file  Food Insecurity: No Food Insecurity (02/13/2021)   Hunger Vital Sign    Worried About Running Out of Food in the Last Year: Never true    Ran Out of Food in the Last Year: Never true  Transportation Needs: No Transportation Needs (02/13/2021)   PRAPARE - Hydrologist (Medical): No    Lack of Transportation (Non-Medical): No  Physical Activity: Not on file  Stress: Stress Concern Present (02/13/2021)   [place]    Feeling of Stress : To some extent  Social Connections: Not on file    Allergies  Allergen Reactions   Trazodone     Other reaction(s): Dizziness, GI Upset (intolerance), Other (See Comments) Other reaction(s): GI Upset (intolerance) Extreme Drowsiness  Current Outpatient Medications:    acetaminophen (TYLENOL) 325 MG tablet, Take by mouth., Disp: , Rfl:    ascorbic acid (VITAMIN C) 500 MG tablet, Take by mouth., Disp: , Rfl:    aspirin 81 MG chewable tablet, Chew 81 mg by mouth daily., Disp: , Rfl:    buPROPion (WELLBUTRIN XL) 150 MG 24 hr tablet, Take 150 mg by mouth daily., Disp: , Rfl:    busPIRone (BUSPAR) 15 MG tablet, Take by mouth., Disp: , Rfl:    carvedilol (COREG) 3.125 MG tablet, Take 1 tablet (3.125 mg total) by mouth 2 (two)  times daily with a meal., Disp: 60 tablet, Rfl: 3   Cholecalciferol (VITAMIN D3) 2000 UNITS capsule, Take 2,000 Units by mouth daily., Disp: , Rfl:    colestipol (COLESTID) 1 g tablet, Take 1 g by mouth 2 (two) times daily., Disp: , Rfl:    diltiazem (CARDIZEM) 30 MG tablet, Take 1 tablet (30 mg total) by mouth every 12 (twelve) hours., Disp: 60 tablet, Rfl: 3   famotidine (PEPCID) 20 MG tablet, Take 20 mg by mouth daily., Disp: , Rfl:    FLUoxetine (PROZAC) 20 MG capsule, Take 20 mg by mouth daily., Disp: , Rfl:    gabapentin (NEURONTIN) 300 MG capsule, Take 1 capsule (300 mg total) by mouth at bedtime., Disp: , Rfl:    ondansetron (ZOFRAN-ODT) 4 MG disintegrating tablet, Take by mouth., Disp: , Rfl:    potassium chloride SA (K-DUR,KLOR-CON) 20 MEQ tablet, Take 1 tablet (20 mEq total) by mouth daily., Disp: , Rfl:    rivaroxaban (XARELTO) 20 MG TABS tablet, Take 1 tablet (20 mg total) by mouth daily., Disp: 30 tablet, Rfl: 3   rosuvastatin (CRESTOR) 10 MG tablet, Take 10 mg by mouth daily., Disp: , Rfl:    tiZANidine (ZANAFLEX) 2 MG tablet, Take by mouth., Disp: , Rfl:    tiZANidine (ZANAFLEX) 4 MG tablet, Take 2 mg by mouth 2 (two) times daily., Disp: , Rfl:    traMADol (ULTRAM) 50 MG tablet, Take 50 mg by mouth in the morning, at noon, and at bedtime., Disp: , Rfl:    busPIRone (BUSPAR) 15 MG tablet, Take 15 mg by mouth 2 (two) times daily., Disp: , Rfl:     Review of Systems  Constitutional:  Negative for activity change, appetite change, chills, diaphoresis, fatigue, fever and unexpected weight change.  HENT:  Negative for congestion, rhinorrhea, sinus pressure, sneezing, sore throat and trouble swallowing.   Eyes:  Negative for photophobia and visual disturbance.  Respiratory:  Negative for cough, chest tightness, shortness of breath, wheezing and stridor.   Cardiovascular:  Negative for chest pain, palpitations and leg swelling.  Gastrointestinal:  Negative for abdominal distention,  abdominal pain, anal bleeding, blood in stool, constipation, diarrhea, nausea and vomiting.  Genitourinary:  Negative for difficulty urinating, dysuria, flank pain and hematuria.  Musculoskeletal:  Positive for arthralgias. Negative for back pain, gait problem, joint swelling and myalgias.  Skin:  Positive for wound. Negative for color change, pallor and rash.  Neurological:  Negative for dizziness, tremors, weakness and light-headedness.  Hematological:  Negative for adenopathy. Does not bruise/bleed easily.  Psychiatric/Behavioral:  Negative for agitation, behavioral problems, confusion, decreased concentration, dysphoric mood and sleep disturbance.        Objective:   Physical Exam Constitutional:      General: She is not in acute distress.    Appearance: Normal appearance. She is well-developed. She is not ill-appearing or diaphoretic.  HENT:  Head: Normocephalic and atraumatic.     Right Ear: Hearing and external ear normal.     Left Ear: Hearing and external ear normal.     Nose: No nasal deformity or rhinorrhea.  Eyes:     General: No scleral icterus.    Conjunctiva/sclera: Conjunctivae normal.     Right eye: Right conjunctiva is not injected.     Left eye: Left conjunctiva is not injected.     Pupils: Pupils are equal, round, and reactive to light.  Neck:     Vascular: No JVD.  Cardiovascular:     Rate and Rhythm: Normal rate and regular rhythm.     Heart sounds: S1 normal and S2 normal.  Pulmonary:     Effort: Pulmonary effort is normal. No respiratory distress.     Breath sounds: No wheezing.  Abdominal:     General: Bowel sounds are normal. There is no distension.     Palpations: Abdomen is soft.     Tenderness: There is no abdominal tenderness.  Musculoskeletal:        General: Normal range of motion.     Right shoulder: Normal.     Left shoulder: Normal.     Cervical back: Normal range of motion and neck supple.     Right hip: Normal.     Left hip: Normal.      Right knee: Normal.     Left knee: Normal.  Lymphadenopathy:     Head:     Right side of head: No submandibular, preauricular or posterior auricular adenopathy.     Left side of head: No submandibular, preauricular or posterior auricular adenopathy.     Cervical: No cervical adenopathy.     Right cervical: No superficial or deep cervical adenopathy.    Left cervical: No superficial or deep cervical adenopathy.  Skin:    General: Skin is warm and dry.     Coloration: Skin is not pale.     Findings: No abrasion, bruising, ecchymosis, erythema, lesion or rash.     Nails: There is no clubbing.  Neurological:     General: No focal deficit present.     Mental Status: She is alert and oriented to person, place, and time.     Sensory: No sensory deficit.     Coordination: Coordination normal.     Gait: Gait normal.  Psychiatric:        Attention and Perception: She is attentive.        Speech: Speech normal.        Behavior: Behavior normal. Behavior is cooperative.        Thought Content: Thought content normal.        Judgment: Judgment normal.    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  Her stop date currently is November 21.  I will plan on seeing her prior to that stop date consider whether to extend IV antibiotics versus make the switch at that point in time to oral antibiotics Bactrim would probably be my preferred antibiotic to cover the Morganella rather than a flora quinolone which would hazard C. difficile colitis.  The corynebacterium may be able to be treated with Augmentin Zyvox would be another option but not realistic 1 for  long-term treatment I would want her on long-term IV oral antibiotics for least 6  months if not a year.  Check a BMP with GFR sed rate CRP and CBC with differential today as well as a vancomycin random level.  Vaccine counseling she has already had her updated COVID-19 and flu shots.  I spent 90 minutes with the patient including than 50% of the time in face to face counseling of the patient guarding her prosthetic shoulder infection  along with review of medical records  from [facility] and from SNF, in preparation for the visit and during the visit and in coordination of her care.

## 2021-11-20 LAB — BASIC METABOLIC PANEL WITH GFR
BUN/Creatinine Ratio: 17 (calc) (ref 6–22)
BUN: 9 mg/dL (ref 7–25)
CO2: 23 mmol/L (ref 20–32)
Calcium: 9 mg/dL (ref 8.6–10.4)
Chloride: 113 mmol/L — ABNORMAL HIGH (ref 98–110)
Creat: 0.52 mg/dL — ABNORMAL LOW (ref 0.60–1.00)
Glucose, Bld: 95 mg/dL (ref 65–99)
Potassium: 3.5 mmol/L (ref 3.5–5.3)
Sodium: 145 mmol/L (ref 135–146)
eGFR: 96 mL/min/{1.73_m2} (ref >=60)

## 2021-11-20 LAB — VANCOMYCIN, RANDOM: Vancomycin Rm: 10 mg/L

## 2021-11-20 LAB — SEDIMENTATION RATE: Sed Rate: 50 mm/h — ABNORMAL HIGH (ref 0–30)

## 2021-11-20 LAB — C-REACTIVE PROTEIN: CRP: 15.8 mg/L — ABNORMAL HIGH (ref <8.0)

## 2021-11-20 NOTE — Telephone Encounter (Signed)
Labs faxed to [facility] today

## 2021-12-06 ENCOUNTER — Other Ambulatory Visit: Payer: Self-pay | Admitting: *Deleted

## 2021-12-06 NOTE — Patient Outreach (Addendum)
Per [facility] Mrs. Deadwyler  resides in [facility]. Screening for potential [facility] care coordination services as benefit of insurance plan and PCP.  Secure communication sent to SNF social worker to inquire about transition plans.   Addendum: Update received from [place] [place] social worker. Mrs. Ambrosino is currently in iv antibiotics. Transition plan is to return home. Has son who assists.   Will continue to follow.      Marthenia Rolling, MSN, RN,BSN [place] Acute Care Coordinator [phone] (Direct dial)

## 2021-12-09 ENCOUNTER — Other Ambulatory Visit: Payer: Self-pay

## 2021-12-09 ENCOUNTER — Ambulatory Visit (INDEPENDENT_AMBULATORY_CARE_PROVIDER_SITE_OTHER): Payer: Medicare Other | Admitting: Infectious Disease

## 2021-12-09 VITALS — BP 93/58 | HR 78 | Resp 16 | Ht 61.0 in | Wt 179.0 lb

## 2021-12-09 DIAGNOSIS — Z7185 Encounter for immunization safety counseling: Secondary | ICD-10-CM

## 2021-12-09 DIAGNOSIS — A498 Other bacterial infections of unspecified site: Secondary | ICD-10-CM | POA: Diagnosis not present

## 2021-12-09 DIAGNOSIS — T8459XD Infection and inflammatory reaction due to other internal joint prosthesis, subsequent encounter: Secondary | ICD-10-CM | POA: Diagnosis not present

## 2021-12-09 DIAGNOSIS — I10 Essential (primary) hypertension: Secondary | ICD-10-CM | POA: Diagnosis not present

## 2021-12-09 DIAGNOSIS — Z96611 Presence of right artificial shoulder joint: Secondary | ICD-10-CM | POA: Diagnosis not present

## 2021-12-09 MED ORDER — AMOXICILLIN-POT CLAVULANATE 875-125 MG PO TABS: 1.0000 | ORAL_TABLET | Freq: Two times a day (BID) | ORAL | 5 refills | Status: DC

## 2021-12-09 MED ORDER — SULFAMETHOXAZOLE-TRIMETHOPRIM 800-160 MG PO TABS: 1.0000 | ORAL_TABLET | Freq: Two times a day (BID) | ORAL | 11 refills | Status: DC

## 2021-12-09 NOTE — Progress Notes (Signed)
Subjective:  Chief complaint: Follow-up for prosthetic shoulder infection still with some residual pain    Patient ID: Mary Weeks, female    DOB: 04/06/1945, 76 y.o.   MRN: [id number]  HPI  Mary Weeks is a very lovely 76 year old Caucasian woman with a past medical history significant for coronary artery disease atrial fibrillation who sustained a fracture in her arm with proximal humerus malunion.  She underwent reverse shoulder arthroplasty for this proximal humerus malunion in September of this year.  This is performed by Dr. Dwain Sarna at [facility].  Unfortunately she developed ongoing drainage and had serial washouts and exchange of the modular components performed on October 24, 2021 with repeat washouts on the October 27, 2021 and October 29, 2021.  Intraoperative cultures yielded Morganella species that was fairly resistant to most antibiotics though in terms of oral antibiotics it appears to be sensitive to fluoroquinolones and Bactrim.  Corynebacterium stratum was also isolated.  She was placed on vancomycin 1 g daily as well as ertapenem 1 g daily.  In looking through the records at the skilled nursing facility where she resides at [place] [facility] in [facility] care [facility] I did not see any evidence of labs having been done.  It is imperative that while she is on vancomycin that her vancomycin levels are checked twice weekly and a BMP with GFR is tract biweekly ensure we do not run into nephrotoxicity.  She says her shoulder pain is improved when I saw her in clinic for consultation.   Today I saw her again and her pain continues to improve.  She says they are checking labs at the nursing facility although I do not know if there is checking twice weekly metabolic panels and looking through the medical administration of review it appears she is still receiving vancomycin 1 g daily and ertapenem 1 g daily with stop date being tomorrow.     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    Social History   Socioeconomic History   Marital status: Widowed    Spouse name: Not on file   Number of children: 1   Years of education: Not on file   Highest education level: 12th grade  Occupational History   Occupation: Retired  Tobacco Use   Smoking status: Never   Smokeless tobacco: Never  Vaping Use   Vaping Use: Never used  Substance and Sexual Activity   Alcohol use: No   Drug use: Never   Sexual activity: Not Currently  Other Topics Concern   Not on file  Social History Narrative   Not on file   Social Determinants of Health   Financial Resource Strain: Not on file  Food Insecurity: No Food Insecurity (02/13/2021)   Hunger Vital Sign    Worried About Running Out of Food in the Last Year: Never true    Ran Out of Food in the Last Year: Never true  Transportation Needs: No Transportation Needs (02/13/2021)   PRAPARE -  Hydrologist (Medical): No    Lack of Transportation (Non-Medical): No  Physical Activity: Not on file  Stress: Stress Concern Present (02/13/2021)   [place]    Feeling of Stress : To some extent  Social Connections: Not on file    Allergies  Allergen Reactions   Trazodone     Other reaction(s): Dizziness, GI Upset (intolerance), Other (See Comments) Other reaction(s): GI Upset (intolerance) Extreme  Drowsiness       Current Outpatient Medications:    acetaminophen (TYLENOL) 325 MG tablet, Take by mouth., Disp: , Rfl:    ascorbic acid (VITAMIN C) 500 MG tablet, Take by mouth., Disp: , Rfl:    aspirin 81 MG chewable tablet, Chew 81 mg by mouth daily., Disp: , Rfl:    buPROPion (WELLBUTRIN XL) 150 MG 24 hr tablet, Take 150 mg by mouth daily., Disp: , Rfl:    busPIRone (BUSPAR) 15 MG tablet, Take 15 mg by mouth 2 (two) times daily., Disp: , Rfl:    busPIRone (BUSPAR) 15 MG tablet, Take by mouth., Disp: , Rfl:    carvedilol (COREG) 3.125 MG tablet, Take 1 tablet (3.125 mg total) by mouth 2 (two) times daily with a meal., Disp: 60 tablet, Rfl: 3   Cholecalciferol (VITAMIN D3) 2000 UNITS capsule, Take 2,000 Units by mouth daily., Disp: , Rfl:    colestipol (COLESTID) 1 g tablet, Take 1 g by mouth 2 (two) times daily., Disp: , Rfl:    diltiazem (CARDIZEM) 30 MG tablet, Take 1 tablet (30 mg total) by mouth every 12 (twelve) hours., Disp: 60 tablet, Rfl: 3   famotidine (PEPCID) 20 MG tablet, Take 20 mg by mouth daily., Disp: , Rfl:    FLUoxetine (PROZAC) 20 MG capsule, Take 20 mg by mouth daily., Disp: , Rfl:    gabapentin (NEURONTIN) 300 MG capsule, Take 1 capsule (300 mg total) by mouth at bedtime., Disp: , Rfl:    ondansetron (ZOFRAN-ODT) 4 MG disintegrating tablet, Take by mouth., Disp: , Rfl:    potassium chloride SA (K-DUR,KLOR-CON) 20 MEQ tablet, Take 1 tablet (20 mEq total) by mouth daily., Disp: , Rfl:    rivaroxaban (XARELTO) 20 MG TABS tablet, Take 1 tablet (20 mg total) by mouth daily., Disp: 30 tablet, Rfl: 3   rosuvastatin (CRESTOR) 10 MG tablet, Take 10 mg by mouth daily., Disp: , Rfl:    tiZANidine (ZANAFLEX) 2 MG tablet, Take by mouth., Disp: , Rfl:    tiZANidine (ZANAFLEX) 4 MG tablet, Take 2 mg by mouth 2 (two) times daily., Disp: , Rfl:    traMADol (ULTRAM) 50 MG tablet, Take 50 mg by mouth in the morning, at noon, and at bedtime., Disp: , Rfl:     Review of Systems   Constitutional:  Negative for activity change, appetite change, chills, diaphoresis, fatigue, fever and unexpected weight change.  HENT:  Negative for congestion, rhinorrhea, sinus pressure, sneezing, sore throat and trouble swallowing.   Eyes:  Negative for photophobia and visual disturbance.  Respiratory:  Negative for cough, chest tightness, shortness of breath, wheezing and stridor.   Cardiovascular:  Negative for chest pain, palpitations and leg swelling.  Gastrointestinal:  Negative for abdominal distention, abdominal pain, anal bleeding, blood in stool, constipation, diarrhea, nausea and vomiting.  Genitourinary:  Negative for difficulty urinating, dysuria, flank pain and hematuria.  Musculoskeletal:  Negative for arthralgias, back pain, gait problem, joint swelling and myalgias.  Skin:  Positive  for wound. Negative for color change, pallor and rash.  Neurological:  Negative for dizziness, tremors, weakness and light-headedness.  Hematological:  Negative for adenopathy. Does not bruise/bleed easily.  Psychiatric/Behavioral:  Negative for agitation, behavioral problems, confusion, decreased concentration, dysphoric mood and sleep disturbance.        Objective:   Physical Exam Constitutional:      General: She is not in acute distress.    Appearance: Normal appearance. She is well-developed. She is obese. She is not ill-appearing or diaphoretic.  HENT:     Head: Normocephalic and atraumatic.     Right Ear: Hearing and external ear normal.     Left Ear: Hearing and external ear normal.     Nose: No nasal deformity or rhinorrhea.  Eyes:     General: No scleral icterus.    Conjunctiva/sclera: Conjunctivae normal.     Right eye: Right conjunctiva is not injected.     Left eye: Left conjunctiva is not injected.     Pupils: Pupils are equal, round, and reactive to light.  Neck:     Vascular: No JVD.  Cardiovascular:     Rate and Rhythm: Normal rate and regular rhythm.     Heart  sounds: S1 normal and S2 normal.  Pulmonary:     Effort: Pulmonary effort is normal. No respiratory distress.     Breath sounds: No wheezing.  Abdominal:     Palpations: Abdomen is soft.  Musculoskeletal:        General: Normal range of motion.     Right shoulder: Normal.     Left shoulder: Normal.     Cervical back: Normal range of motion and neck supple.     Right hip: Normal.     Left hip: Normal.     Right knee: Normal.     Left knee: Normal.  Lymphadenopathy:     Head:     Right side of head: No submandibular, preauricular or posterior auricular adenopathy.     Left side of head: No submandibular, preauricular or posterior auricular adenopathy.     Cervical: No cervical adenopathy.     Right cervical: No superficial or deep cervical adenopathy.    Left cervical: No superficial or deep cervical adenopathy.  Skin:    General: Skin is warm and dry.     Coloration: Skin is pale.     Findings: No abrasion, bruising, ecchymosis, erythema, lesion or rash.     Nails: There is no clubbing.  Neurological:     Mental Status: She is alert and oriented to person, place, and time.     Sensory: No sensory deficit.     Coordination: Coordination normal.     Gait: Gait normal.  Psychiatric:        Attention and Perception: She is attentive.        Mood and Affect: Mood normal.        Speech: Speech normal.        Behavior: Behavior normal. Behavior is cooperative.        Thought Content: Thought content normal.        Judgment: Judgment normal.    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  We will plan on giving her oral Bactrim  target the Morganella along with Augmentin target the corynebacterium  . SHE NEEDS TO HAVE A BMP W GFR AND CBC  W DIFF ONE WEEK AFTER adding the Bactrim.  She will need to be on antibiotics for protracted period of time likely half year but potentially up to a year with close monitoring.  Hypertension: continue current medications  I spent 28mnutes with the patient including than 50% of the time in face to face counseling of the patient regarding her prosthetic joint infection, along with review of medical records in preparation for the visit and during the visit and in coordination of her care.  Vaccine counseling recommended updated COVID-19 vaccine which she will receive today.

## 2021-12-16 ENCOUNTER — Other Ambulatory Visit: Payer: Self-pay | Admitting: *Deleted

## 2021-12-16 DIAGNOSIS — I10 Essential (primary) hypertension: Secondary | ICD-10-CM

## 2021-12-16 NOTE — Patient Outreach (Signed)
[facility] Post- Acute Care Coordinator follow up. Verified in [facility] Mrs. Lacson discharged from [place] on 12/13/21.   SNF social worker reports Mrs.Gonsalves has limited support at home. [facility] home health was arranged for PT and aide services.   Will make referral to [facility] care coordination team.   Marthenia Rolling, MSN, RN,BSN [place] Acute Care Coordinator [phone] (Direct dial)

## 2021-12-18 ENCOUNTER — Telehealth: Payer: Self-pay | Admitting: *Deleted

## 2021-12-18 NOTE — Progress Notes (Signed)
Care Coordination  Outreach Note  12/18/2021 Name: Eleene Nicolaus MRN: [id number] DOB: [birth year]   Care Coordination Outreach Attempts: An unsuccessful telephone outreach was attempted today to offer the patient information about available care coordination services as a benefit of their health plan.   Follow Up Plan:  Additional outreach attempts will be made to offer the patient care coordination information and services.   Encounter Outcome:  No Answer  Gwenevere Ghazi  Care Coordination Care Guide  Direct Dial: [phone]

## 2021-12-19 DIAGNOSIS — Z96611 Presence of right artificial shoulder joint: Secondary | ICD-10-CM | POA: Diagnosis not present

## 2021-12-19 NOTE — Progress Notes (Signed)
  Care Coordination   Note   12/19/2021 Name: [name] MRN: [id number] DOB: 16-Mar-1945  Mary Weeks is a 76 y.o. year old female who sees Hasanaj, Samul Dada, MD for primary care. I reached out to Donell Beers by phone today to offer care coordination services.  [name] was given information about Care Coordination services today including:   The Care Coordination services include support from the care team which includes your Nurse Coordinator, Clinical Social Worker, or Pharmacist.  The Care Coordination team is here to help remove barriers to the health concerns and goals most important to you. Care Coordination services are voluntary, and the patient may decline or stop services at any time by request to their care team member.   Care Coordination Consent Status: Patient agreed to services and verbal consent obtained.   Follow up plan:  Telephone appointment with care coordination team member scheduled for:  12/20/21  Encounter Outcome:  Pt. Scheduled  [place]  Direct Dial: [phone]

## 2021-12-20 ENCOUNTER — Ambulatory Visit: Payer: Self-pay | Admitting: *Deleted

## 2021-12-20 NOTE — Patient Outreach (Signed)
  Care Coordination   12/20/2021 Name: Mary Weeks MRN: [id number] DOB: [birth year]   Care Coordination Outreach Attempts:  An unsuccessful telephone outreach was attempted for a scheduled appointment today.  Follow Up Plan:  Additional outreach attempts will be made to offer the patient care coordination information and services.   Encounter Outcome:  No Answer   Care Coordination Interventions:  No, not indicated    Chong Sicilian, BSN, RN-BC RN Care Coordinator [place]: [phone] Main #: [phone]

## 2021-12-24 ENCOUNTER — Telehealth: Payer: Self-pay | Admitting: *Deleted

## 2021-12-24 DIAGNOSIS — I4891 Unspecified atrial fibrillation: Secondary | ICD-10-CM | POA: Diagnosis not present

## 2021-12-24 DIAGNOSIS — E7800 Pure hypercholesterolemia, unspecified: Secondary | ICD-10-CM | POA: Diagnosis not present

## 2021-12-24 DIAGNOSIS — G8929 Other chronic pain: Secondary | ICD-10-CM | POA: Diagnosis not present

## 2021-12-24 DIAGNOSIS — Z9049 Acquired absence of other specified parts of digestive tract: Secondary | ICD-10-CM | POA: Diagnosis not present

## 2021-12-24 DIAGNOSIS — Z87891 Personal history of nicotine dependence: Secondary | ICD-10-CM | POA: Diagnosis not present

## 2021-12-24 DIAGNOSIS — M1990 Unspecified osteoarthritis, unspecified site: Secondary | ICD-10-CM | POA: Diagnosis not present

## 2021-12-24 DIAGNOSIS — I2510 Atherosclerotic heart disease of native coronary artery without angina pectoris: Secondary | ICD-10-CM | POA: Diagnosis not present

## 2021-12-24 DIAGNOSIS — Z9181 History of falling: Secondary | ICD-10-CM | POA: Diagnosis not present

## 2021-12-24 DIAGNOSIS — S42251D Displaced fracture of greater tuberosity of right humerus, subsequent encounter for fracture with routine healing: Secondary | ICD-10-CM | POA: Diagnosis not present

## 2021-12-24 DIAGNOSIS — F32A Depression, unspecified: Secondary | ICD-10-CM | POA: Diagnosis not present

## 2021-12-24 DIAGNOSIS — M797 Fibromyalgia: Secondary | ICD-10-CM | POA: Diagnosis not present

## 2021-12-24 DIAGNOSIS — I10 Essential (primary) hypertension: Secondary | ICD-10-CM | POA: Diagnosis not present

## 2021-12-24 DIAGNOSIS — Z96611 Presence of right artificial shoulder joint: Secondary | ICD-10-CM | POA: Diagnosis not present

## 2021-12-24 DIAGNOSIS — Z96653 Presence of artificial knee joint, bilateral: Secondary | ICD-10-CM | POA: Diagnosis not present

## 2021-12-24 DIAGNOSIS — T8141XD Infection following a procedure, superficial incisional surgical site, subsequent encounter: Secondary | ICD-10-CM | POA: Diagnosis not present

## 2021-12-24 DIAGNOSIS — Z791 Long term (current) use of non-steroidal anti-inflammatories (NSAID): Secondary | ICD-10-CM | POA: Diagnosis not present

## 2021-12-24 NOTE — Progress Notes (Signed)
  Care Coordination Note  12/24/2021 Name: Mary Weeks MRN: [id number] DOB: 11-26-1945  Mary Weeks is a 76 y.o. year old female who is a primary care patient of Hasanaj, Samul Dada, MD and is actively engaged with the care management team. I reached out to Donell Beers by phone today to assist with re-scheduling an initial visit with the RN Case Manager  Follow up plan: Unsuccessful telephone outreach attempt made.    [place]  Direct Dial: [phone]

## 2021-12-26 ENCOUNTER — Other Ambulatory Visit: Payer: Self-pay

## 2021-12-26 MED ORDER — AMOXICILLIN-POT CLAVULANATE 875-125 MG PO TABS: 1.0000 | ORAL_TABLET | Freq: Two times a day (BID) | ORAL | 5 refills | Status: DC

## 2021-12-26 MED ORDER — SULFAMETHOXAZOLE-TRIMETHOPRIM 800-160 MG PO TABS: 1.0000 | ORAL_TABLET | Freq: Two times a day (BID) | ORAL | 11 refills | Status: AC

## 2021-12-26 NOTE — Progress Notes (Signed)
Lucilla Edin, NP called regarding a home visit she had with the patient today. Kenney Houseman stated patient wasn't aware to continue the Augmentin and bactrim prescribed. Patient has been discharged from the facility and stated they didn't not send the medication home with her. I advised Tonya and the patient that patient may be on the antibiotics up to a year per Dr. Lucianne Lei Dam's last office visit note. Patient verbalized understanding and I have sent the Augmentin and bactrim to her local pharmacy to pick up.  Abdulloh Ullom T Brooks Sailors

## 2021-12-30 DIAGNOSIS — M797 Fibromyalgia: Secondary | ICD-10-CM | POA: Diagnosis not present

## 2021-12-30 DIAGNOSIS — I2510 Atherosclerotic heart disease of native coronary artery without angina pectoris: Secondary | ICD-10-CM | POA: Diagnosis not present

## 2021-12-30 DIAGNOSIS — S42251D Displaced fracture of greater tuberosity of right humerus, subsequent encounter for fracture with routine healing: Secondary | ICD-10-CM | POA: Diagnosis not present

## 2021-12-30 DIAGNOSIS — Z791 Long term (current) use of non-steroidal anti-inflammatories (NSAID): Secondary | ICD-10-CM | POA: Diagnosis not present

## 2021-12-30 DIAGNOSIS — M1990 Unspecified osteoarthritis, unspecified site: Secondary | ICD-10-CM | POA: Diagnosis not present

## 2021-12-30 DIAGNOSIS — Z87891 Personal history of nicotine dependence: Secondary | ICD-10-CM | POA: Diagnosis not present

## 2021-12-30 DIAGNOSIS — Z96653 Presence of artificial knee joint, bilateral: Secondary | ICD-10-CM | POA: Diagnosis not present

## 2021-12-30 DIAGNOSIS — Z9181 History of falling: Secondary | ICD-10-CM | POA: Diagnosis not present

## 2021-12-30 DIAGNOSIS — I4891 Unspecified atrial fibrillation: Secondary | ICD-10-CM | POA: Diagnosis not present

## 2021-12-30 DIAGNOSIS — G8929 Other chronic pain: Secondary | ICD-10-CM | POA: Diagnosis not present

## 2021-12-30 DIAGNOSIS — Z96611 Presence of right artificial shoulder joint: Secondary | ICD-10-CM | POA: Diagnosis not present

## 2021-12-30 DIAGNOSIS — E7800 Pure hypercholesterolemia, unspecified: Secondary | ICD-10-CM | POA: Diagnosis not present

## 2021-12-30 DIAGNOSIS — T8141XD Infection following a procedure, superficial incisional surgical site, subsequent encounter: Secondary | ICD-10-CM | POA: Diagnosis not present

## 2021-12-30 DIAGNOSIS — Z9049 Acquired absence of other specified parts of digestive tract: Secondary | ICD-10-CM | POA: Diagnosis not present

## 2021-12-30 DIAGNOSIS — I10 Essential (primary) hypertension: Secondary | ICD-10-CM | POA: Diagnosis not present

## 2021-12-30 DIAGNOSIS — F32A Depression, unspecified: Secondary | ICD-10-CM | POA: Diagnosis not present

## 2022-01-01 NOTE — Progress Notes (Signed)
  Care Coordination Note  01/01/2022 Name: [name] MRN: [id number] DOB: [birth year]  Mary Weeks is a 76 y.o. year old female who is a primary care patient of Hasanaj, Samul Dada, MD and is actively engaged with the care management team. I reached out to Donell Beers by phone today to assist with re-scheduling an initial visit with the RN Case Manager  Follow up plan: Unsuccessful telephone outreach attempt made. A HIPAA compliant phone message was left for the patient providing contact information and requesting a return call.  We have been unable to make contact with the patient for follow up. The care management team is available to follow up with the patient after provider conversation with the patient regarding recommendation for care management engagement and subsequent re-referral to the care management team.   [place]  Direct Dial: [phone]

## 2022-01-02 DIAGNOSIS — Z791 Long term (current) use of non-steroidal anti-inflammatories (NSAID): Secondary | ICD-10-CM | POA: Diagnosis not present

## 2022-01-02 DIAGNOSIS — Z9181 History of falling: Secondary | ICD-10-CM | POA: Diagnosis not present

## 2022-01-02 DIAGNOSIS — I10 Essential (primary) hypertension: Secondary | ICD-10-CM | POA: Diagnosis not present

## 2022-01-02 DIAGNOSIS — T8141XD Infection following a procedure, superficial incisional surgical site, subsequent encounter: Secondary | ICD-10-CM | POA: Diagnosis not present

## 2022-01-02 DIAGNOSIS — Z96611 Presence of right artificial shoulder joint: Secondary | ICD-10-CM | POA: Diagnosis not present

## 2022-01-02 DIAGNOSIS — I2510 Atherosclerotic heart disease of native coronary artery without angina pectoris: Secondary | ICD-10-CM | POA: Diagnosis not present

## 2022-01-02 DIAGNOSIS — M797 Fibromyalgia: Secondary | ICD-10-CM | POA: Diagnosis not present

## 2022-01-02 DIAGNOSIS — Z9049 Acquired absence of other specified parts of digestive tract: Secondary | ICD-10-CM | POA: Diagnosis not present

## 2022-01-02 DIAGNOSIS — Z96653 Presence of artificial knee joint, bilateral: Secondary | ICD-10-CM | POA: Diagnosis not present

## 2022-01-02 DIAGNOSIS — F32A Depression, unspecified: Secondary | ICD-10-CM | POA: Diagnosis not present

## 2022-01-02 DIAGNOSIS — M1990 Unspecified osteoarthritis, unspecified site: Secondary | ICD-10-CM | POA: Diagnosis not present

## 2022-01-02 DIAGNOSIS — E7800 Pure hypercholesterolemia, unspecified: Secondary | ICD-10-CM | POA: Diagnosis not present

## 2022-01-02 DIAGNOSIS — Z87891 Personal history of nicotine dependence: Secondary | ICD-10-CM | POA: Diagnosis not present

## 2022-01-02 DIAGNOSIS — S42251D Displaced fracture of greater tuberosity of right humerus, subsequent encounter for fracture with routine healing: Secondary | ICD-10-CM | POA: Diagnosis not present

## 2022-01-02 DIAGNOSIS — G8929 Other chronic pain: Secondary | ICD-10-CM | POA: Diagnosis not present

## 2022-01-02 DIAGNOSIS — I4891 Unspecified atrial fibrillation: Secondary | ICD-10-CM | POA: Diagnosis not present

## 2022-01-08 DIAGNOSIS — Z9181 History of falling: Secondary | ICD-10-CM | POA: Diagnosis not present

## 2022-01-08 DIAGNOSIS — I10 Essential (primary) hypertension: Secondary | ICD-10-CM | POA: Diagnosis not present

## 2022-01-08 DIAGNOSIS — I2510 Atherosclerotic heart disease of native coronary artery without angina pectoris: Secondary | ICD-10-CM | POA: Diagnosis not present

## 2022-01-08 DIAGNOSIS — S42251D Displaced fracture of greater tuberosity of right humerus, subsequent encounter for fracture with routine healing: Secondary | ICD-10-CM | POA: Diagnosis not present

## 2022-01-08 DIAGNOSIS — Z87891 Personal history of nicotine dependence: Secondary | ICD-10-CM | POA: Diagnosis not present

## 2022-01-08 DIAGNOSIS — M1990 Unspecified osteoarthritis, unspecified site: Secondary | ICD-10-CM | POA: Diagnosis not present

## 2022-01-08 DIAGNOSIS — E7800 Pure hypercholesterolemia, unspecified: Secondary | ICD-10-CM | POA: Diagnosis not present

## 2022-01-08 DIAGNOSIS — Z9049 Acquired absence of other specified parts of digestive tract: Secondary | ICD-10-CM | POA: Diagnosis not present

## 2022-01-08 DIAGNOSIS — F32A Depression, unspecified: Secondary | ICD-10-CM | POA: Diagnosis not present

## 2022-01-08 DIAGNOSIS — G8929 Other chronic pain: Secondary | ICD-10-CM | POA: Diagnosis not present

## 2022-01-08 DIAGNOSIS — I4891 Unspecified atrial fibrillation: Secondary | ICD-10-CM | POA: Diagnosis not present

## 2022-01-08 DIAGNOSIS — T8141XD Infection following a procedure, superficial incisional surgical site, subsequent encounter: Secondary | ICD-10-CM | POA: Diagnosis not present

## 2022-01-08 DIAGNOSIS — Z96653 Presence of artificial knee joint, bilateral: Secondary | ICD-10-CM | POA: Diagnosis not present

## 2022-01-08 DIAGNOSIS — Z791 Long term (current) use of non-steroidal anti-inflammatories (NSAID): Secondary | ICD-10-CM | POA: Diagnosis not present

## 2022-01-08 DIAGNOSIS — Z96611 Presence of right artificial shoulder joint: Secondary | ICD-10-CM | POA: Diagnosis not present

## 2022-01-08 DIAGNOSIS — M797 Fibromyalgia: Secondary | ICD-10-CM | POA: Diagnosis not present

## 2022-01-08 NOTE — Progress Notes (Deleted)
Subjective:  Chief complaint: followup for PJI with shoulder      Patient ID: Mary Weeks, female    DOB: [birth year], 76 y.o.   MRN: [id number]  HPI  Day[place] is a very lovely 76 year old Caucasian woman with a past medical history significant for coronary artery disease atrial fibrillation who sustained a fracture in her arm with proximal humerus malunion.  She underwent reverse shoulder arthroplasty for this proximal humerus malunion in September of this year.  This is performed by Dr. Dwain Sarna at [facility].  Unfortunately she developed ongoing drainage and had serial washouts and exchange of the nodular components performed on October 24, 2021 with repeat washouts on the October 27, 2021 and October 29, 2021.  Intraoperative cultures yielded Morganella species that was fairly resistant to most antibiotics though in terms of oral antibiotics it appears to be sensitive to fluoroquinolones and Bactrim.  Corynebacterium stratum was also isolated.  She was placed on vancomycin 1 g daily as well as ertapenem 1 g daily.  In looking through the records at the skilled nursing facility where she resides at [facility] I initially did not see any evidence of labs having been done.  It  was imperative that while she is on vancomycin that her vancomycin levels are checked twice weekly and a BMP with GFR is tract biweekly ensure we do not run into nephrotoxicity.  She says her shoulder pain is improved when I saw her in clinic for consultation.   When I saw her last her pain had continued to improve.  My plan was to start bactrim and augmentin after she finished IV abx though I am not certain the augmentin--which should cover the C striatum species.    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    Social History   Socioeconomic History   Marital status: Widowed    Spouse name: Not on file   Number of children: 1   Years of education: Not on file   Highest education level: 12th grade  Occupational History   Occupation: Retired  Tobacco Use   Smoking status: Never   Smokeless tobacco: Never  Vaping Use   Vaping Use: Never used  Substance and Sexual Activity   Alcohol use: No   Drug use: Never   Sexual activity: Not Currently  Other Topics Concern   Not on file  Social History Narrative   Not on file   Social Determinants of Health   Financial Resource Strain: Not on file  Food Insecurity: No Food Insecurity (02/13/2021)   Hunger Vital Sign    Worried About Running Out of Food in the Last Year: Never true    Ran Out of Food in the Last Year: Never true  Transportation Needs: No Transportation Needs (02/13/2021)   PRAPARE - Hydrologist (Medical): No    Lack of Transportation (Non-Medical): No  Physical Activity: Not on file  Stress: Stress  Concern Present (02/13/2021)   [place]    Feeling of Stress : To some extent  Social Connections: Not on file    Allergies  Allergen Reactions   Trazodone     Other reaction(s): Dizziness, GI Upset (intolerance), Other (See Comments) Other reaction(s): GI Upset (intolerance) Extreme Drowsiness       Current Outpatient Medications:    acetaminophen (TYLENOL) 325 MG tablet, Take by mouth., Disp: , Rfl:    amoxicillin-clavulanate (AUGMENTIN) 875-125 MG tablet,  Take 1 tablet by mouth 2 (two) times daily., Disp: 60 tablet, Rfl: 5   ascorbic acid (VITAMIN C) 500 MG tablet, Take by mouth., Disp: , Rfl:    aspirin 81 MG chewable tablet, Chew 81 mg by mouth daily., Disp: , Rfl:    buPROPion (WELLBUTRIN XL) 150 MG 24 hr tablet, Take 150 mg by mouth daily., Disp: , Rfl:    busPIRone (BUSPAR) 15 MG tablet, Take by mouth., Disp: , Rfl:    carvedilol (COREG) 3.125 MG tablet, Take 1 tablet (3.125 mg total) by mouth 2 (two) times daily with a meal., Disp: 60 tablet, Rfl: 3   Cholecalciferol (VITAMIN D3) 2000 UNITS capsule, Take 2,000 Units by mouth daily., Disp: , Rfl:    colestipol (COLESTID) 1 g tablet, Take 1 g by mouth 2 (two) times daily., Disp: , Rfl:    diltiazem (CARDIZEM) 30 MG tablet, Take 1 tablet (30 mg total) by mouth every 12 (twelve) hours. (Patient not taking: Reported on 12/09/2021), Disp: 60 tablet, Rfl: 3   famotidine (PEPCID) 20 MG tablet, Take 20 mg by mouth daily., Disp: , Rfl:    FLUoxetine (PROZAC) 20 MG capsule, Take 20 mg by mouth daily., Disp: , Rfl:    gabapentin (NEURONTIN) 300 MG capsule, Take 1 capsule (300 mg total) by mouth at bedtime., Disp: , Rfl:    Multiple Vitamins-Minerals (THERATRUM COMPLETE PO), Take by mouth., Disp: , Rfl:    ondansetron (ZOFRAN-ODT) 4 MG disintegrating tablet, Take by mouth., Disp: , Rfl:    potassium chloride SA (K-DUR,KLOR-CON) 20 MEQ tablet, Take 1 tablet (20 mEq total) by mouth daily., Disp: , Rfl:    pravastatin (PRAVACHOL) 80 MG tablet, Take 80 mg by mouth daily., Disp: , Rfl:    rivaroxaban (XARELTO) 20 MG TABS tablet, Take 1 tablet (20 mg total) by mouth daily. (Patient not taking: Reported on 12/09/2021), Disp: 30 tablet, Rfl: 3   rosuvastatin (CRESTOR) 10 MG tablet, Take 10 mg by mouth daily. (Patient not taking: Reported on 12/09/2021), Disp: , Rfl:    sulfamethoxazole-trimethoprim (BACTRIM DS) 800-160 MG tablet, Take 1 tablet by mouth 2 (two) times daily., Disp: 60 tablet, Rfl: 11   tiZANidine  (ZANAFLEX) 2 MG tablet, Take by mouth. 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  We will plan on giving  her oral Bactrim target the Morganella along with Augmentin target the corynebacterium  . SHE NEEDS TO HAVE A BMP W GFR AND CBC W DIFF ONE WEEK AFTER adding the Bactrim.  She will need to be on antibiotics for protracted period of time likely half year but potentially up to a year with close monitoring.  Hypertension: continue current medications

## 2022-01-09 ENCOUNTER — Ambulatory Visit: Payer: Medicare Other | Admitting: Infectious Disease

## 2022-01-09 ENCOUNTER — Telehealth: Payer: Self-pay

## 2022-01-09 DIAGNOSIS — A498 Other bacterial infections of unspecified site: Secondary | ICD-10-CM

## 2022-01-09 DIAGNOSIS — Z96619 Presence of unspecified artificial shoulder joint: Secondary | ICD-10-CM

## 2022-01-09 DIAGNOSIS — I10 Essential (primary) hypertension: Secondary | ICD-10-CM

## 2022-01-09 DIAGNOSIS — I2510 Atherosclerotic heart disease of native coronary artery without angina pectoris: Secondary | ICD-10-CM

## 2022-01-09 DIAGNOSIS — B948 Sequelae of other specified infectious and parasitic diseases: Secondary | ICD-10-CM

## 2022-01-09 DIAGNOSIS — I480 Paroxysmal atrial fibrillation: Secondary | ICD-10-CM

## 2022-01-09 NOTE — Telephone Encounter (Signed)
Called patient x2 to reschedule today's missed appointment, busy signal both attempts.   Beryle Flock, RN

## 2022-01-10 DIAGNOSIS — E7800 Pure hypercholesterolemia, unspecified: Secondary | ICD-10-CM | POA: Diagnosis not present

## 2022-01-10 DIAGNOSIS — S42251D Displaced fracture of greater tuberosity of right humerus, subsequent encounter for fracture with routine healing: Secondary | ICD-10-CM | POA: Diagnosis not present

## 2022-01-10 DIAGNOSIS — G8929 Other chronic pain: Secondary | ICD-10-CM | POA: Diagnosis not present

## 2022-01-10 DIAGNOSIS — Z9181 History of falling: Secondary | ICD-10-CM | POA: Diagnosis not present

## 2022-01-10 DIAGNOSIS — Z87891 Personal history of nicotine dependence: Secondary | ICD-10-CM | POA: Diagnosis not present

## 2022-01-10 DIAGNOSIS — I4891 Unspecified atrial fibrillation: Secondary | ICD-10-CM | POA: Diagnosis not present

## 2022-01-10 DIAGNOSIS — Z96653 Presence of artificial knee joint, bilateral: Secondary | ICD-10-CM | POA: Diagnosis not present

## 2022-01-10 DIAGNOSIS — Z791 Long term (current) use of non-steroidal anti-inflammatories (NSAID): Secondary | ICD-10-CM | POA: Diagnosis not present

## 2022-01-10 DIAGNOSIS — I10 Essential (primary) hypertension: Secondary | ICD-10-CM | POA: Diagnosis not present

## 2022-01-10 DIAGNOSIS — Z9049 Acquired absence of other specified parts of digestive tract: Secondary | ICD-10-CM | POA: Diagnosis not present

## 2022-01-10 DIAGNOSIS — T8141XD Infection following a procedure, superficial incisional surgical site, subsequent encounter: Secondary | ICD-10-CM | POA: Diagnosis not present

## 2022-01-10 DIAGNOSIS — Z96611 Presence of right artificial shoulder joint: Secondary | ICD-10-CM | POA: Diagnosis not present

## 2022-01-10 DIAGNOSIS — I2510 Atherosclerotic heart disease of native coronary artery without angina pectoris: Secondary | ICD-10-CM | POA: Diagnosis not present

## 2022-01-10 DIAGNOSIS — F32A Depression, unspecified: Secondary | ICD-10-CM | POA: Diagnosis not present

## 2022-01-10 DIAGNOSIS — M1990 Unspecified osteoarthritis, unspecified site: Secondary | ICD-10-CM | POA: Diagnosis not present

## 2022-01-10 DIAGNOSIS — M797 Fibromyalgia: Secondary | ICD-10-CM | POA: Diagnosis not present

## 2022-01-16 DIAGNOSIS — S42251D Displaced fracture of greater tuberosity of right humerus, subsequent encounter for fracture with routine healing: Secondary | ICD-10-CM | POA: Diagnosis not present

## 2022-01-16 DIAGNOSIS — M1990 Unspecified osteoarthritis, unspecified site: Secondary | ICD-10-CM | POA: Diagnosis not present

## 2022-01-16 DIAGNOSIS — Z87891 Personal history of nicotine dependence: Secondary | ICD-10-CM | POA: Diagnosis not present

## 2022-01-16 DIAGNOSIS — E7800 Pure hypercholesterolemia, unspecified: Secondary | ICD-10-CM | POA: Diagnosis not present

## 2022-01-16 DIAGNOSIS — I4891 Unspecified atrial fibrillation: Secondary | ICD-10-CM | POA: Diagnosis not present

## 2022-01-16 DIAGNOSIS — Z9049 Acquired absence of other specified parts of digestive tract: Secondary | ICD-10-CM | POA: Diagnosis not present

## 2022-01-16 DIAGNOSIS — F32A Depression, unspecified: Secondary | ICD-10-CM | POA: Diagnosis not present

## 2022-01-16 DIAGNOSIS — Z9181 History of falling: Secondary | ICD-10-CM | POA: Diagnosis not present

## 2022-01-16 DIAGNOSIS — Z96653 Presence of artificial knee joint, bilateral: Secondary | ICD-10-CM | POA: Diagnosis not present

## 2022-01-16 DIAGNOSIS — I2510 Atherosclerotic heart disease of native coronary artery without angina pectoris: Secondary | ICD-10-CM | POA: Diagnosis not present

## 2022-01-16 DIAGNOSIS — M797 Fibromyalgia: Secondary | ICD-10-CM | POA: Diagnosis not present

## 2022-01-16 DIAGNOSIS — I10 Essential (primary) hypertension: Secondary | ICD-10-CM | POA: Diagnosis not present

## 2022-01-16 DIAGNOSIS — G8929 Other chronic pain: Secondary | ICD-10-CM | POA: Diagnosis not present

## 2022-01-16 DIAGNOSIS — T8141XD Infection following a procedure, superficial incisional surgical site, subsequent encounter: Secondary | ICD-10-CM | POA: Diagnosis not present

## 2022-01-16 DIAGNOSIS — Z791 Long term (current) use of non-steroidal anti-inflammatories (NSAID): Secondary | ICD-10-CM | POA: Diagnosis not present

## 2022-01-16 DIAGNOSIS — Z96611 Presence of right artificial shoulder joint: Secondary | ICD-10-CM | POA: Diagnosis not present

## 2022-01-22 DIAGNOSIS — F32A Depression, unspecified: Secondary | ICD-10-CM | POA: Diagnosis not present

## 2022-01-22 DIAGNOSIS — Z96611 Presence of right artificial shoulder joint: Secondary | ICD-10-CM | POA: Diagnosis not present

## 2022-01-22 DIAGNOSIS — T8141XD Infection following a procedure, superficial incisional surgical site, subsequent encounter: Secondary | ICD-10-CM | POA: Diagnosis not present

## 2022-01-22 DIAGNOSIS — I4891 Unspecified atrial fibrillation: Secondary | ICD-10-CM | POA: Diagnosis not present

## 2022-01-22 DIAGNOSIS — Z791 Long term (current) use of non-steroidal anti-inflammatories (NSAID): Secondary | ICD-10-CM | POA: Diagnosis not present

## 2022-01-22 DIAGNOSIS — Z9049 Acquired absence of other specified parts of digestive tract: Secondary | ICD-10-CM | POA: Diagnosis not present

## 2022-01-22 DIAGNOSIS — G8929 Other chronic pain: Secondary | ICD-10-CM | POA: Diagnosis not present

## 2022-01-22 DIAGNOSIS — Z96653 Presence of artificial knee joint, bilateral: Secondary | ICD-10-CM | POA: Diagnosis not present

## 2022-01-22 DIAGNOSIS — I2510 Atherosclerotic heart disease of native coronary artery without angina pectoris: Secondary | ICD-10-CM | POA: Diagnosis not present

## 2022-01-22 DIAGNOSIS — E7800 Pure hypercholesterolemia, unspecified: Secondary | ICD-10-CM | POA: Diagnosis not present

## 2022-01-22 DIAGNOSIS — M797 Fibromyalgia: Secondary | ICD-10-CM | POA: Diagnosis not present

## 2022-01-22 DIAGNOSIS — S42251D Displaced fracture of greater tuberosity of right humerus, subsequent encounter for fracture with routine healing: Secondary | ICD-10-CM | POA: Diagnosis not present

## 2022-01-22 DIAGNOSIS — Z87891 Personal history of nicotine dependence: Secondary | ICD-10-CM | POA: Diagnosis not present

## 2022-01-22 DIAGNOSIS — M1990 Unspecified osteoarthritis, unspecified site: Secondary | ICD-10-CM | POA: Diagnosis not present

## 2022-01-22 DIAGNOSIS — I10 Essential (primary) hypertension: Secondary | ICD-10-CM | POA: Diagnosis not present

## 2022-01-22 DIAGNOSIS — Z9181 History of falling: Secondary | ICD-10-CM | POA: Diagnosis not present

## 2022-01-23 DIAGNOSIS — S42251D Displaced fracture of greater tuberosity of right humerus, subsequent encounter for fracture with routine healing: Secondary | ICD-10-CM | POA: Diagnosis not present

## 2022-01-23 DIAGNOSIS — M1990 Unspecified osteoarthritis, unspecified site: Secondary | ICD-10-CM | POA: Diagnosis not present

## 2022-01-23 DIAGNOSIS — M797 Fibromyalgia: Secondary | ICD-10-CM | POA: Diagnosis not present

## 2022-01-23 DIAGNOSIS — Z9049 Acquired absence of other specified parts of digestive tract: Secondary | ICD-10-CM | POA: Diagnosis not present

## 2022-01-23 DIAGNOSIS — T8141XD Infection following a procedure, superficial incisional surgical site, subsequent encounter: Secondary | ICD-10-CM | POA: Diagnosis not present

## 2022-01-23 DIAGNOSIS — G8929 Other chronic pain: Secondary | ICD-10-CM | POA: Diagnosis not present

## 2022-01-23 DIAGNOSIS — Z96653 Presence of artificial knee joint, bilateral: Secondary | ICD-10-CM | POA: Diagnosis not present

## 2022-01-23 DIAGNOSIS — Z96611 Presence of right artificial shoulder joint: Secondary | ICD-10-CM | POA: Diagnosis not present

## 2022-01-23 DIAGNOSIS — I2510 Atherosclerotic heart disease of native coronary artery without angina pectoris: Secondary | ICD-10-CM | POA: Diagnosis not present

## 2022-01-23 DIAGNOSIS — I4891 Unspecified atrial fibrillation: Secondary | ICD-10-CM | POA: Diagnosis not present

## 2022-01-23 DIAGNOSIS — Z87891 Personal history of nicotine dependence: Secondary | ICD-10-CM | POA: Diagnosis not present

## 2022-01-23 DIAGNOSIS — I10 Essential (primary) hypertension: Secondary | ICD-10-CM | POA: Diagnosis not present

## 2022-01-23 DIAGNOSIS — Z9181 History of falling: Secondary | ICD-10-CM | POA: Diagnosis not present

## 2022-01-23 DIAGNOSIS — F32A Depression, unspecified: Secondary | ICD-10-CM | POA: Diagnosis not present

## 2022-01-23 DIAGNOSIS — E7800 Pure hypercholesterolemia, unspecified: Secondary | ICD-10-CM | POA: Diagnosis not present

## 2022-01-23 DIAGNOSIS — Z791 Long term (current) use of non-steroidal anti-inflammatories (NSAID): Secondary | ICD-10-CM | POA: Diagnosis not present

## 2022-02-03 ENCOUNTER — Telehealth: Payer: Self-pay

## 2022-02-03 NOTE — Telephone Encounter (Signed)
Received call from [facility] with [facility] regarding amoxicillin. Would like to know duration for antibiotic and diagnosis.  Per last note patient could be on antibiotics for six months up to a year. For prosthetic shoulder infection.  Leatrice Jewels, RMA

## 2022-02-10 DIAGNOSIS — Z96611 Presence of right artificial shoulder joint: Secondary | ICD-10-CM | POA: Diagnosis not present

## 2022-02-11 ENCOUNTER — Telehealth: Payer: Self-pay

## 2022-02-11 NOTE — Telephone Encounter (Signed)
Returned call to pt nurse to schedule an appt---left msg--[phone]

## 2022-02-21 ENCOUNTER — Ambulatory Visit: Payer: Medicare Other | Admitting: Internal Medicine

## 2022-02-27 DIAGNOSIS — I10 Essential (primary) hypertension: Secondary | ICD-10-CM | POA: Diagnosis not present

## 2022-02-27 DIAGNOSIS — J4520 Mild intermittent asthma, uncomplicated: Secondary | ICD-10-CM | POA: Diagnosis not present

## 2022-02-27 DIAGNOSIS — Z0000 Encounter for general adult medical examination without abnormal findings: Secondary | ICD-10-CM | POA: Diagnosis not present

## 2022-02-27 DIAGNOSIS — M797 Fibromyalgia: Secondary | ICD-10-CM | POA: Diagnosis not present

## 2022-02-27 DIAGNOSIS — E7849 Other hyperlipidemia: Secondary | ICD-10-CM | POA: Diagnosis not present

## 2022-02-27 DIAGNOSIS — K219 Gastro-esophageal reflux disease without esophagitis: Secondary | ICD-10-CM | POA: Diagnosis not present

## 2022-06-25 DIAGNOSIS — N1831 Chronic kidney disease, stage 3a: Secondary | ICD-10-CM | POA: Diagnosis not present

## 2022-06-25 DIAGNOSIS — Z0000 Encounter for general adult medical examination without abnormal findings: Secondary | ICD-10-CM | POA: Diagnosis not present

## 2022-06-25 DIAGNOSIS — Z6830 Body mass index (BMI) 30.0-30.9, adult: Secondary | ICD-10-CM | POA: Diagnosis not present

## 2022-06-25 DIAGNOSIS — K219 Gastro-esophageal reflux disease without esophagitis: Secondary | ICD-10-CM | POA: Diagnosis not present

## 2022-06-25 DIAGNOSIS — I10 Essential (primary) hypertension: Secondary | ICD-10-CM | POA: Diagnosis not present

## 2022-06-25 DIAGNOSIS — E7849 Other hyperlipidemia: Secondary | ICD-10-CM | POA: Diagnosis not present

## 2022-06-25 DIAGNOSIS — J4520 Mild intermittent asthma, uncomplicated: Secondary | ICD-10-CM | POA: Diagnosis not present

## 2022-06-25 DIAGNOSIS — M797 Fibromyalgia: Secondary | ICD-10-CM | POA: Diagnosis not present

## 2022-08-03 ENCOUNTER — Other Ambulatory Visit: Payer: Self-pay | Admitting: Infectious Disease

## 2022-09-06 DIAGNOSIS — E7800 Pure hypercholesterolemia, unspecified: Secondary | ICD-10-CM | POA: Diagnosis not present

## 2022-09-06 DIAGNOSIS — G8929 Other chronic pain: Secondary | ICD-10-CM | POA: Diagnosis not present

## 2022-09-06 DIAGNOSIS — Z743 Need for continuous supervision: Secondary | ICD-10-CM | POA: Diagnosis not present

## 2022-09-06 DIAGNOSIS — R079 Chest pain, unspecified: Secondary | ICD-10-CM | POA: Diagnosis not present

## 2022-09-06 DIAGNOSIS — Z87891 Personal history of nicotine dependence: Secondary | ICD-10-CM | POA: Diagnosis not present

## 2022-09-06 DIAGNOSIS — K219 Gastro-esophageal reflux disease without esophagitis: Secondary | ICD-10-CM | POA: Diagnosis not present

## 2022-09-06 DIAGNOSIS — R0789 Other chest pain: Secondary | ICD-10-CM | POA: Diagnosis not present

## 2022-09-06 DIAGNOSIS — R9431 Abnormal electrocardiogram [ECG] [EKG]: Secondary | ICD-10-CM | POA: Diagnosis not present

## 2022-09-06 DIAGNOSIS — Z79899 Other long term (current) drug therapy: Secondary | ICD-10-CM | POA: Diagnosis not present

## 2022-09-06 DIAGNOSIS — I700 Atherosclerosis of aorta: Secondary | ICD-10-CM | POA: Diagnosis not present

## 2022-09-06 DIAGNOSIS — E876 Hypokalemia: Secondary | ICD-10-CM | POA: Diagnosis not present

## 2022-09-06 DIAGNOSIS — Z7982 Long term (current) use of aspirin: Secondary | ICD-10-CM | POA: Diagnosis not present

## 2022-09-06 DIAGNOSIS — R110 Nausea: Secondary | ICD-10-CM | POA: Diagnosis not present

## 2022-09-06 DIAGNOSIS — I10 Essential (primary) hypertension: Secondary | ICD-10-CM | POA: Diagnosis not present

## 2022-09-06 DIAGNOSIS — I440 Atrioventricular block, first degree: Secondary | ICD-10-CM | POA: Diagnosis not present

## 2022-09-06 DIAGNOSIS — M549 Dorsalgia, unspecified: Secondary | ICD-10-CM | POA: Diagnosis not present

## 2022-09-06 DIAGNOSIS — N3000 Acute cystitis without hematuria: Secondary | ICD-10-CM | POA: Diagnosis not present

## 2022-09-06 DIAGNOSIS — I2510 Atherosclerotic heart disease of native coronary artery without angina pectoris: Secondary | ICD-10-CM | POA: Diagnosis not present

## 2022-09-06 DIAGNOSIS — Z96611 Presence of right artificial shoulder joint: Secondary | ICD-10-CM | POA: Diagnosis not present

## 2022-09-06 DIAGNOSIS — M797 Fibromyalgia: Secondary | ICD-10-CM | POA: Diagnosis not present

## 2022-10-23 DIAGNOSIS — J4520 Mild intermittent asthma, uncomplicated: Secondary | ICD-10-CM | POA: Diagnosis not present

## 2022-10-23 DIAGNOSIS — I10 Essential (primary) hypertension: Secondary | ICD-10-CM | POA: Diagnosis not present

## 2022-10-23 DIAGNOSIS — Z8673 Personal history of transient ischemic attack (TIA), and cerebral infarction without residual deficits: Secondary | ICD-10-CM | POA: Diagnosis not present

## 2022-10-23 DIAGNOSIS — M797 Fibromyalgia: Secondary | ICD-10-CM | POA: Diagnosis not present

## 2022-10-23 DIAGNOSIS — K219 Gastro-esophageal reflux disease without esophagitis: Secondary | ICD-10-CM | POA: Diagnosis not present

## 2022-10-23 DIAGNOSIS — N1831 Chronic kidney disease, stage 3a: Secondary | ICD-10-CM | POA: Diagnosis not present

## 2022-10-23 DIAGNOSIS — D692 Other nonthrombocytopenic purpura: Secondary | ICD-10-CM | POA: Diagnosis not present

## 2022-10-23 DIAGNOSIS — E7849 Other hyperlipidemia: Secondary | ICD-10-CM | POA: Diagnosis not present

## 2022-11-01 ENCOUNTER — Other Ambulatory Visit: Payer: Self-pay | Admitting: Infectious Disease

## 2022-11-20 ENCOUNTER — Encounter: Payer: Self-pay | Admitting: Infectious Disease

## 2022-12-19 DIAGNOSIS — Z131 Encounter for screening for diabetes mellitus: Secondary | ICD-10-CM | POA: Diagnosis not present

## 2022-12-19 DIAGNOSIS — E782 Mixed hyperlipidemia: Secondary | ICD-10-CM | POA: Diagnosis not present

## 2022-12-19 DIAGNOSIS — Z79899 Other long term (current) drug therapy: Secondary | ICD-10-CM | POA: Diagnosis not present

## 2023-04-15 DIAGNOSIS — J0191 Acute recurrent sinusitis, unspecified: Secondary | ICD-10-CM | POA: Diagnosis not present

## 2023-04-15 DIAGNOSIS — Z0000 Encounter for general adult medical examination without abnormal findings: Secondary | ICD-10-CM | POA: Diagnosis not present

## 2023-04-15 DIAGNOSIS — I10 Essential (primary) hypertension: Secondary | ICD-10-CM | POA: Diagnosis not present

## 2023-04-15 DIAGNOSIS — G458 Other transient cerebral ischemic attacks and related syndromes: Secondary | ICD-10-CM | POA: Diagnosis not present

## 2023-04-15 DIAGNOSIS — Z8673 Personal history of transient ischemic attack (TIA), and cerebral infarction without residual deficits: Secondary | ICD-10-CM | POA: Diagnosis not present

## 2023-04-15 DIAGNOSIS — D692 Other nonthrombocytopenic purpura: Secondary | ICD-10-CM | POA: Diagnosis not present

## 2023-04-15 DIAGNOSIS — M797 Fibromyalgia: Secondary | ICD-10-CM | POA: Diagnosis not present

## 2023-04-15 DIAGNOSIS — Z6833 Body mass index (BMI) 33.0-33.9, adult: Secondary | ICD-10-CM | POA: Diagnosis not present

## 2023-04-15 DIAGNOSIS — J4520 Mild intermittent asthma, uncomplicated: Secondary | ICD-10-CM | POA: Diagnosis not present

## 2023-04-15 DIAGNOSIS — K219 Gastro-esophageal reflux disease without esophagitis: Secondary | ICD-10-CM | POA: Diagnosis not present

## 2023-04-15 DIAGNOSIS — N1832 Chronic kidney disease, stage 3b: Secondary | ICD-10-CM | POA: Diagnosis not present

## 2023-04-15 DIAGNOSIS — E7849 Other hyperlipidemia: Secondary | ICD-10-CM | POA: Diagnosis not present

## 2023-06-21 DIAGNOSIS — Z79899 Other long term (current) drug therapy: Secondary | ICD-10-CM | POA: Diagnosis not present

## 2023-06-21 DIAGNOSIS — Z7982 Long term (current) use of aspirin: Secondary | ICD-10-CM | POA: Diagnosis not present

## 2023-06-21 DIAGNOSIS — R9431 Abnormal electrocardiogram [ECG] [EKG]: Secondary | ICD-10-CM | POA: Diagnosis not present

## 2023-06-21 DIAGNOSIS — R079 Chest pain, unspecified: Secondary | ICD-10-CM | POA: Diagnosis not present

## 2023-06-21 DIAGNOSIS — R531 Weakness: Secondary | ICD-10-CM | POA: Diagnosis not present

## 2023-06-21 DIAGNOSIS — Z87891 Personal history of nicotine dependence: Secondary | ICD-10-CM | POA: Diagnosis not present

## 2023-06-21 DIAGNOSIS — Z96611 Presence of right artificial shoulder joint: Secondary | ICD-10-CM | POA: Diagnosis not present

## 2023-06-21 DIAGNOSIS — R42 Dizziness and giddiness: Secondary | ICD-10-CM | POA: Diagnosis not present

## 2023-06-21 DIAGNOSIS — Z20822 Contact with and (suspected) exposure to covid-19: Secondary | ICD-10-CM | POA: Diagnosis not present

## 2023-06-21 DIAGNOSIS — M797 Fibromyalgia: Secondary | ICD-10-CM | POA: Diagnosis not present

## 2023-06-21 DIAGNOSIS — R404 Transient alteration of awareness: Secondary | ICD-10-CM | POA: Diagnosis not present

## 2023-06-21 DIAGNOSIS — I2510 Atherosclerotic heart disease of native coronary artery without angina pectoris: Secondary | ICD-10-CM | POA: Diagnosis not present

## 2023-06-21 DIAGNOSIS — I10 Essential (primary) hypertension: Secondary | ICD-10-CM | POA: Diagnosis not present

## 2023-06-21 DIAGNOSIS — E876 Hypokalemia: Secondary | ICD-10-CM | POA: Diagnosis not present

## 2023-06-21 DIAGNOSIS — W07XXXA Fall from chair, initial encounter: Secondary | ICD-10-CM | POA: Diagnosis not present

## 2023-06-21 DIAGNOSIS — E7800 Pure hypercholesterolemia, unspecified: Secondary | ICD-10-CM | POA: Diagnosis not present

## 2023-06-30 ENCOUNTER — Telehealth: Payer: Self-pay

## 2023-06-30 NOTE — Telephone Encounter (Signed)
 Patient called stating she has bug bites and was wondering if Dr. Ernie Heal could send in an antibiotic for her. Discussed that she would need to be evaluated and Dr. Ernie Heal is not in the office this week. Recommended she call her PCP. Patient verbalized understanding and has no further questions.   Semisi Biela, BSN, RN

## 2023-08-06 DIAGNOSIS — Z8673 Personal history of transient ischemic attack (TIA), and cerebral infarction without residual deficits: Secondary | ICD-10-CM | POA: Diagnosis not present

## 2023-08-06 DIAGNOSIS — E7849 Other hyperlipidemia: Secondary | ICD-10-CM | POA: Diagnosis not present

## 2023-08-06 DIAGNOSIS — D692 Other nonthrombocytopenic purpura: Secondary | ICD-10-CM | POA: Diagnosis not present

## 2023-08-06 DIAGNOSIS — I10 Essential (primary) hypertension: Secondary | ICD-10-CM | POA: Diagnosis not present

## 2023-08-06 DIAGNOSIS — Z0000 Encounter for general adult medical examination without abnormal findings: Secondary | ICD-10-CM | POA: Diagnosis not present

## 2023-08-06 DIAGNOSIS — J302 Other seasonal allergic rhinitis: Secondary | ICD-10-CM | POA: Diagnosis not present

## 2023-08-06 DIAGNOSIS — N182 Chronic kidney disease, stage 2 (mild): Secondary | ICD-10-CM | POA: Diagnosis not present

## 2023-08-06 DIAGNOSIS — K219 Gastro-esophageal reflux disease without esophagitis: Secondary | ICD-10-CM | POA: Diagnosis not present

## 2023-08-06 DIAGNOSIS — J4520 Mild intermittent asthma, uncomplicated: Secondary | ICD-10-CM | POA: Diagnosis not present

## 2023-08-06 DIAGNOSIS — M797 Fibromyalgia: Secondary | ICD-10-CM | POA: Diagnosis not present

## 2023-09-28 DIAGNOSIS — Z8589 Personal history of malignant neoplasm of other organs and systems: Secondary | ICD-10-CM | POA: Diagnosis not present

## 2023-09-28 DIAGNOSIS — R110 Nausea: Secondary | ICD-10-CM | POA: Diagnosis not present

## 2023-09-28 DIAGNOSIS — Z87891 Personal history of nicotine dependence: Secondary | ICD-10-CM | POA: Diagnosis not present

## 2023-09-28 DIAGNOSIS — N281 Cyst of kidney, acquired: Secondary | ICD-10-CM | POA: Diagnosis not present

## 2023-09-28 DIAGNOSIS — S0990XD Unspecified injury of head, subsequent encounter: Secondary | ICD-10-CM | POA: Diagnosis not present

## 2023-09-28 DIAGNOSIS — R54 Age-related physical debility: Secondary | ICD-10-CM | POA: Diagnosis not present

## 2023-09-28 DIAGNOSIS — E7800 Pure hypercholesterolemia, unspecified: Secondary | ICD-10-CM | POA: Diagnosis not present

## 2023-09-28 DIAGNOSIS — I4891 Unspecified atrial fibrillation: Secondary | ICD-10-CM | POA: Diagnosis not present

## 2023-09-28 DIAGNOSIS — M6281 Muscle weakness (generalized): Secondary | ICD-10-CM | POA: Diagnosis not present

## 2023-09-28 DIAGNOSIS — S301XXA Contusion of abdominal wall, initial encounter: Secondary | ICD-10-CM | POA: Diagnosis not present

## 2023-09-28 DIAGNOSIS — Z9181 History of falling: Secondary | ICD-10-CM | POA: Diagnosis not present

## 2023-09-28 DIAGNOSIS — G8929 Other chronic pain: Secondary | ICD-10-CM | POA: Diagnosis not present

## 2023-09-28 DIAGNOSIS — W57XXXA Bitten or stung by nonvenomous insect and other nonvenomous arthropods, initial encounter: Secondary | ICD-10-CM | POA: Diagnosis not present

## 2023-09-28 DIAGNOSIS — Z7409 Other reduced mobility: Secondary | ICD-10-CM | POA: Diagnosis not present

## 2023-09-28 DIAGNOSIS — D649 Anemia, unspecified: Secondary | ICD-10-CM | POA: Diagnosis not present

## 2023-09-28 DIAGNOSIS — R42 Dizziness and giddiness: Secondary | ICD-10-CM | POA: Diagnosis not present

## 2023-09-28 DIAGNOSIS — M1990 Unspecified osteoarthritis, unspecified site: Secondary | ICD-10-CM | POA: Diagnosis not present

## 2023-09-28 DIAGNOSIS — S7002XA Contusion of left hip, initial encounter: Secondary | ICD-10-CM | POA: Diagnosis not present

## 2023-09-28 DIAGNOSIS — Z79899 Other long term (current) drug therapy: Secondary | ICD-10-CM | POA: Diagnosis not present

## 2023-09-28 DIAGNOSIS — I2510 Atherosclerotic heart disease of native coronary artery without angina pectoris: Secondary | ICD-10-CM | POA: Diagnosis not present

## 2023-09-28 DIAGNOSIS — E875 Hyperkalemia: Secondary | ICD-10-CM | POA: Diagnosis not present

## 2023-09-28 DIAGNOSIS — W19XXXA Unspecified fall, initial encounter: Secondary | ICD-10-CM | POA: Diagnosis not present

## 2023-09-28 DIAGNOSIS — L089 Local infection of the skin and subcutaneous tissue, unspecified: Secondary | ICD-10-CM | POA: Diagnosis not present

## 2023-09-28 DIAGNOSIS — R531 Weakness: Secondary | ICD-10-CM | POA: Diagnosis not present

## 2023-09-28 DIAGNOSIS — E876 Hypokalemia: Secondary | ICD-10-CM | POA: Diagnosis not present

## 2023-09-28 DIAGNOSIS — Z789 Other specified health status: Secondary | ICD-10-CM | POA: Diagnosis not present

## 2023-09-28 DIAGNOSIS — R519 Headache, unspecified: Secondary | ICD-10-CM | POA: Diagnosis not present

## 2023-09-28 DIAGNOSIS — R109 Unspecified abdominal pain: Secondary | ICD-10-CM | POA: Diagnosis not present

## 2023-09-28 DIAGNOSIS — I10 Essential (primary) hypertension: Secondary | ICD-10-CM | POA: Diagnosis not present

## 2023-09-28 DIAGNOSIS — M797 Fibromyalgia: Secondary | ICD-10-CM | POA: Diagnosis not present

## 2023-09-28 DIAGNOSIS — M150 Primary generalized (osteo)arthritis: Secondary | ICD-10-CM | POA: Diagnosis not present

## 2023-09-28 DIAGNOSIS — T148XXA Other injury of unspecified body region, initial encounter: Secondary | ICD-10-CM | POA: Diagnosis not present

## 2023-09-28 DIAGNOSIS — D7389 Other diseases of spleen: Secondary | ICD-10-CM | POA: Diagnosis not present

## 2023-09-28 DIAGNOSIS — S7002XD Contusion of left hip, subsequent encounter: Secondary | ICD-10-CM | POA: Diagnosis not present

## 2023-09-28 DIAGNOSIS — D509 Iron deficiency anemia, unspecified: Secondary | ICD-10-CM | POA: Diagnosis not present

## 2023-09-28 DIAGNOSIS — B888 Other specified infestations: Secondary | ICD-10-CM | POA: Diagnosis not present

## 2023-09-28 DIAGNOSIS — B889 Infestation, unspecified: Secondary | ICD-10-CM | POA: Diagnosis not present

## 2023-09-28 DIAGNOSIS — K08109 Complete loss of teeth, unspecified cause, unspecified class: Secondary | ICD-10-CM | POA: Diagnosis not present

## 2023-09-28 DIAGNOSIS — S0990XA Unspecified injury of head, initial encounter: Secondary | ICD-10-CM | POA: Diagnosis not present

## 2023-09-28 DIAGNOSIS — R1312 Dysphagia, oropharyngeal phase: Secondary | ICD-10-CM | POA: Diagnosis not present

## 2023-10-02 DIAGNOSIS — S0990XD Unspecified injury of head, subsequent encounter: Secondary | ICD-10-CM | POA: Diagnosis not present

## 2023-10-02 DIAGNOSIS — R531 Weakness: Secondary | ICD-10-CM | POA: Diagnosis not present

## 2023-10-02 DIAGNOSIS — R1312 Dysphagia, oropharyngeal phase: Secondary | ICD-10-CM | POA: Diagnosis not present

## 2023-10-02 DIAGNOSIS — E876 Hypokalemia: Secondary | ICD-10-CM | POA: Diagnosis not present

## 2023-10-02 DIAGNOSIS — M6281 Muscle weakness (generalized): Secondary | ICD-10-CM | POA: Diagnosis not present

## 2023-10-02 DIAGNOSIS — I2510 Atherosclerotic heart disease of native coronary artery without angina pectoris: Secondary | ICD-10-CM | POA: Diagnosis not present

## 2023-10-02 DIAGNOSIS — W57XXXA Bitten or stung by nonvenomous insect and other nonvenomous arthropods, initial encounter: Secondary | ICD-10-CM | POA: Diagnosis not present

## 2023-10-02 DIAGNOSIS — I10 Essential (primary) hypertension: Secondary | ICD-10-CM | POA: Diagnosis not present

## 2023-10-02 DIAGNOSIS — G8929 Other chronic pain: Secondary | ICD-10-CM | POA: Diagnosis not present

## 2023-10-02 DIAGNOSIS — D509 Iron deficiency anemia, unspecified: Secondary | ICD-10-CM | POA: Diagnosis not present

## 2023-10-02 DIAGNOSIS — Z7409 Other reduced mobility: Secondary | ICD-10-CM | POA: Diagnosis not present

## 2023-10-02 DIAGNOSIS — M79672 Pain in left foot: Secondary | ICD-10-CM | POA: Diagnosis not present

## 2023-10-02 DIAGNOSIS — M150 Primary generalized (osteo)arthritis: Secondary | ICD-10-CM | POA: Diagnosis not present

## 2023-10-02 DIAGNOSIS — I4891 Unspecified atrial fibrillation: Secondary | ICD-10-CM | POA: Diagnosis not present

## 2023-10-02 DIAGNOSIS — T148XXA Other injury of unspecified body region, initial encounter: Secondary | ICD-10-CM | POA: Diagnosis not present

## 2023-10-02 DIAGNOSIS — S7002XD Contusion of left hip, subsequent encounter: Secondary | ICD-10-CM | POA: Diagnosis not present

## 2023-10-02 DIAGNOSIS — Z9181 History of falling: Secondary | ICD-10-CM | POA: Diagnosis not present

## 2023-10-02 DIAGNOSIS — Z789 Other specified health status: Secondary | ICD-10-CM | POA: Diagnosis not present

## 2023-10-02 DIAGNOSIS — S0990XA Unspecified injury of head, initial encounter: Secondary | ICD-10-CM | POA: Diagnosis not present

## 2023-10-02 DIAGNOSIS — M797 Fibromyalgia: Secondary | ICD-10-CM | POA: Diagnosis not present

## 2023-10-02 DIAGNOSIS — S7002XA Contusion of left hip, initial encounter: Secondary | ICD-10-CM | POA: Diagnosis not present

## 2023-10-02 DIAGNOSIS — L089 Local infection of the skin and subcutaneous tissue, unspecified: Secondary | ICD-10-CM | POA: Diagnosis not present

## 2023-10-02 DIAGNOSIS — W19XXXA Unspecified fall, initial encounter: Secondary | ICD-10-CM | POA: Diagnosis not present

## 2023-10-02 DIAGNOSIS — D649 Anemia, unspecified: Secondary | ICD-10-CM | POA: Diagnosis not present

## 2023-10-02 DIAGNOSIS — S301XXA Contusion of abdominal wall, initial encounter: Secondary | ICD-10-CM | POA: Diagnosis not present

## 2023-10-07 DIAGNOSIS — E876 Hypokalemia: Secondary | ICD-10-CM | POA: Diagnosis not present

## 2023-10-07 DIAGNOSIS — I2510 Atherosclerotic heart disease of native coronary artery without angina pectoris: Secondary | ICD-10-CM | POA: Diagnosis not present

## 2023-10-14 DIAGNOSIS — D509 Iron deficiency anemia, unspecified: Secondary | ICD-10-CM | POA: Diagnosis not present

## 2023-10-14 DIAGNOSIS — I4891 Unspecified atrial fibrillation: Secondary | ICD-10-CM | POA: Diagnosis not present

## 2023-10-14 DIAGNOSIS — E876 Hypokalemia: Secondary | ICD-10-CM | POA: Diagnosis not present

## 2023-10-14 DIAGNOSIS — M150 Primary generalized (osteo)arthritis: Secondary | ICD-10-CM | POA: Diagnosis not present

## 2023-10-15 DIAGNOSIS — Z7409 Other reduced mobility: Secondary | ICD-10-CM | POA: Diagnosis not present

## 2023-10-15 DIAGNOSIS — N182 Chronic kidney disease, stage 2 (mild): Secondary | ICD-10-CM | POA: Diagnosis not present

## 2023-10-15 DIAGNOSIS — E876 Hypokalemia: Secondary | ICD-10-CM | POA: Diagnosis not present

## 2023-10-15 DIAGNOSIS — M6281 Muscle weakness (generalized): Secondary | ICD-10-CM | POA: Diagnosis not present

## 2023-10-15 DIAGNOSIS — D509 Iron deficiency anemia, unspecified: Secondary | ICD-10-CM | POA: Diagnosis not present

## 2023-10-16 DIAGNOSIS — E876 Hypokalemia: Secondary | ICD-10-CM | POA: Diagnosis not present

## 2023-10-16 DIAGNOSIS — Z7409 Other reduced mobility: Secondary | ICD-10-CM | POA: Diagnosis not present

## 2023-10-19 DIAGNOSIS — M6281 Muscle weakness (generalized): Secondary | ICD-10-CM | POA: Diagnosis not present

## 2023-10-19 DIAGNOSIS — E876 Hypokalemia: Secondary | ICD-10-CM | POA: Diagnosis not present

## 2023-10-19 DIAGNOSIS — L2989 Other pruritus: Secondary | ICD-10-CM | POA: Diagnosis not present

## 2023-10-19 DIAGNOSIS — Z7409 Other reduced mobility: Secondary | ICD-10-CM | POA: Diagnosis not present

## 2023-10-19 DIAGNOSIS — Z130 Encounter for screening for diseases of the blood and blood-forming organs and certain disorders involving the immune mechanism: Secondary | ICD-10-CM | POA: Diagnosis not present

## 2023-10-19 DIAGNOSIS — R059 Cough, unspecified: Secondary | ICD-10-CM | POA: Diagnosis not present

## 2023-10-20 DIAGNOSIS — I2510 Atherosclerotic heart disease of native coronary artery without angina pectoris: Secondary | ICD-10-CM | POA: Diagnosis not present

## 2023-10-20 DIAGNOSIS — L2989 Other pruritus: Secondary | ICD-10-CM | POA: Diagnosis not present

## 2023-10-20 DIAGNOSIS — M6281 Muscle weakness (generalized): Secondary | ICD-10-CM | POA: Diagnosis not present

## 2023-10-20 DIAGNOSIS — R509 Fever, unspecified: Secondary | ICD-10-CM | POA: Diagnosis not present

## 2023-10-20 DIAGNOSIS — H524 Presbyopia: Secondary | ICD-10-CM | POA: Diagnosis not present

## 2023-10-20 DIAGNOSIS — E876 Hypokalemia: Secondary | ICD-10-CM | POA: Diagnosis not present

## 2023-10-20 DIAGNOSIS — Z961 Presence of intraocular lens: Secondary | ICD-10-CM | POA: Diagnosis not present

## 2023-10-20 DIAGNOSIS — L089 Local infection of the skin and subcutaneous tissue, unspecified: Secondary | ICD-10-CM | POA: Diagnosis not present

## 2023-10-20 DIAGNOSIS — Z7409 Other reduced mobility: Secondary | ICD-10-CM | POA: Diagnosis not present

## 2023-10-22 DIAGNOSIS — E876 Hypokalemia: Secondary | ICD-10-CM | POA: Diagnosis not present

## 2023-10-27 DIAGNOSIS — E876 Hypokalemia: Secondary | ICD-10-CM | POA: Diagnosis not present

## 2023-10-27 DIAGNOSIS — E871 Hypo-osmolality and hyponatremia: Secondary | ICD-10-CM | POA: Diagnosis not present

## 2023-10-27 DIAGNOSIS — D72829 Elevated white blood cell count, unspecified: Secondary | ICD-10-CM | POA: Diagnosis not present

## 2023-10-29 DIAGNOSIS — E871 Hypo-osmolality and hyponatremia: Secondary | ICD-10-CM | POA: Diagnosis not present

## 2023-10-29 DIAGNOSIS — E876 Hypokalemia: Secondary | ICD-10-CM | POA: Diagnosis not present

## 2023-10-29 DIAGNOSIS — D72829 Elevated white blood cell count, unspecified: Secondary | ICD-10-CM | POA: Diagnosis not present

## 2023-11-02 DIAGNOSIS — R42 Dizziness and giddiness: Secondary | ICD-10-CM | POA: Diagnosis not present

## 2023-11-02 DIAGNOSIS — I959 Hypotension, unspecified: Secondary | ICD-10-CM | POA: Diagnosis not present

## 2023-11-02 DIAGNOSIS — D649 Anemia, unspecified: Secondary | ICD-10-CM | POA: Diagnosis not present

## 2023-11-02 DIAGNOSIS — E876 Hypokalemia: Secondary | ICD-10-CM | POA: Diagnosis not present

## 2023-11-03 DIAGNOSIS — I10 Essential (primary) hypertension: Secondary | ICD-10-CM | POA: Diagnosis not present

## 2023-11-03 DIAGNOSIS — E876 Hypokalemia: Secondary | ICD-10-CM | POA: Diagnosis not present

## 2023-11-03 DIAGNOSIS — D509 Iron deficiency anemia, unspecified: Secondary | ICD-10-CM | POA: Diagnosis not present

## 2023-11-03 DIAGNOSIS — I4891 Unspecified atrial fibrillation: Secondary | ICD-10-CM | POA: Diagnosis not present

## 2023-11-04 DIAGNOSIS — E876 Hypokalemia: Secondary | ICD-10-CM | POA: Diagnosis not present

## 2023-11-04 DIAGNOSIS — I2510 Atherosclerotic heart disease of native coronary artery without angina pectoris: Secondary | ICD-10-CM | POA: Diagnosis not present

## 2023-11-04 DIAGNOSIS — D509 Iron deficiency anemia, unspecified: Secondary | ICD-10-CM | POA: Diagnosis not present

## 2023-11-04 DIAGNOSIS — M797 Fibromyalgia: Secondary | ICD-10-CM | POA: Diagnosis not present

## 2023-11-06 DIAGNOSIS — D649 Anemia, unspecified: Secondary | ICD-10-CM | POA: Diagnosis not present

## 2023-11-06 DIAGNOSIS — I959 Hypotension, unspecified: Secondary | ICD-10-CM | POA: Diagnosis not present

## 2023-11-06 DIAGNOSIS — R233 Spontaneous ecchymoses: Secondary | ICD-10-CM | POA: Diagnosis not present

## 2023-11-06 DIAGNOSIS — E876 Hypokalemia: Secondary | ICD-10-CM | POA: Diagnosis not present

## 2023-11-09 DIAGNOSIS — E876 Hypokalemia: Secondary | ICD-10-CM | POA: Diagnosis not present

## 2023-11-09 DIAGNOSIS — D649 Anemia, unspecified: Secondary | ICD-10-CM | POA: Diagnosis not present

## 2023-11-09 DIAGNOSIS — R233 Spontaneous ecchymoses: Secondary | ICD-10-CM | POA: Diagnosis not present

## 2023-11-12 DIAGNOSIS — M6281 Muscle weakness (generalized): Secondary | ICD-10-CM | POA: Diagnosis not present

## 2023-11-12 DIAGNOSIS — E876 Hypokalemia: Secondary | ICD-10-CM | POA: Diagnosis not present

## 2023-11-12 DIAGNOSIS — E875 Hyperkalemia: Secondary | ICD-10-CM | POA: Diagnosis not present

## 2023-11-12 DIAGNOSIS — I10 Essential (primary) hypertension: Secondary | ICD-10-CM | POA: Diagnosis not present

## 2023-11-16 DIAGNOSIS — E876 Hypokalemia: Secondary | ICD-10-CM | POA: Diagnosis not present

## 2023-11-16 DIAGNOSIS — D72829 Elevated white blood cell count, unspecified: Secondary | ICD-10-CM | POA: Diagnosis not present

## 2023-11-16 DIAGNOSIS — J309 Allergic rhinitis, unspecified: Secondary | ICD-10-CM | POA: Diagnosis not present
